# Patient Record
Sex: Female | Born: 1955 | Race: Black or African American | Hispanic: No | Smoking: Never smoker
Health system: Southern US, Community
[De-identification: ages and names within clinical notes are randomized; demographics above are authoritative.]

## PROBLEM LIST (undated history)

## (undated) DIAGNOSIS — M1990 Unspecified osteoarthritis, unspecified site: Secondary | ICD-10-CM

## (undated) DIAGNOSIS — I10 Essential (primary) hypertension: Secondary | ICD-10-CM

## (undated) DIAGNOSIS — E119 Type 2 diabetes mellitus without complications: Secondary | ICD-10-CM

## (undated) HISTORY — DX: Type 2 diabetes mellitus without complications: E11.9

## (undated) HISTORY — PX: TUBAL LIGATION: SHX77

---

## 2001-10-28 ENCOUNTER — Ambulatory Visit (HOSPITAL_COMMUNITY): Admission: RE | Admit: 2001-10-28 | Discharge: 2001-10-28 | Payer: Self-pay | Admitting: Family Medicine

## 2001-10-28 ENCOUNTER — Encounter: Payer: Self-pay | Admitting: Family Medicine

## 2010-12-24 ENCOUNTER — Emergency Department (HOSPITAL_COMMUNITY)
Admission: EM | Admit: 2010-12-24 | Discharge: 2010-12-24 | Disposition: A | Discharge status: Home / Self Care | Payer: Medicare Other | Attending: Emergency Medicine | Admitting: Emergency Medicine

## 2010-12-24 DIAGNOSIS — Z043 Encounter for examination and observation following other accident: Secondary | ICD-10-CM | POA: Insufficient documentation

## 2013-07-27 ENCOUNTER — Emergency Department (HOSPITAL_COMMUNITY): Payer: Medicare Other

## 2013-07-27 ENCOUNTER — Inpatient Hospital Stay (HOSPITAL_COMMUNITY)
Admission: EM | Admit: 2013-07-27 | Discharge: 2013-08-01 | DRG: 638 | Disposition: A | Discharge status: Home Health | Payer: Medicare Other | Attending: Pulmonary Disease | Admitting: Pulmonary Disease

## 2013-07-27 ENCOUNTER — Encounter (HOSPITAL_COMMUNITY): Payer: Self-pay | Admitting: Emergency Medicine

## 2013-07-27 DIAGNOSIS — A498 Other bacterial infections of unspecified site: Secondary | ICD-10-CM | POA: Diagnosis present

## 2013-07-27 DIAGNOSIS — E1110 Type 2 diabetes mellitus with ketoacidosis without coma: Secondary | ICD-10-CM | POA: Diagnosis present

## 2013-07-27 DIAGNOSIS — I129 Hypertensive chronic kidney disease with stage 1 through stage 4 chronic kidney disease, or unspecified chronic kidney disease: Secondary | ICD-10-CM | POA: Diagnosis present

## 2013-07-27 DIAGNOSIS — N390 Urinary tract infection, site not specified: Secondary | ICD-10-CM | POA: Diagnosis present

## 2013-07-27 DIAGNOSIS — Z833 Family history of diabetes mellitus: Secondary | ICD-10-CM

## 2013-07-27 DIAGNOSIS — I959 Hypotension, unspecified: Secondary | ICD-10-CM | POA: Diagnosis present

## 2013-07-27 DIAGNOSIS — Z6841 Body Mass Index (BMI) 40.0 and over, adult: Secondary | ICD-10-CM

## 2013-07-27 DIAGNOSIS — I10 Essential (primary) hypertension: Secondary | ICD-10-CM | POA: Diagnosis present

## 2013-07-27 DIAGNOSIS — N179 Acute kidney failure, unspecified: Secondary | ICD-10-CM | POA: Diagnosis present

## 2013-07-27 DIAGNOSIS — R531 Weakness: Secondary | ICD-10-CM

## 2013-07-27 DIAGNOSIS — N189 Chronic kidney disease, unspecified: Secondary | ICD-10-CM | POA: Diagnosis present

## 2013-07-27 DIAGNOSIS — E876 Hypokalemia: Secondary | ICD-10-CM | POA: Diagnosis present

## 2013-07-27 DIAGNOSIS — E1310 Other specified diabetes mellitus with ketoacidosis without coma: Principal | ICD-10-CM | POA: Diagnosis present

## 2013-07-27 DIAGNOSIS — M129 Arthropathy, unspecified: Secondary | ICD-10-CM | POA: Diagnosis present

## 2013-07-27 HISTORY — DX: Unspecified osteoarthritis, unspecified site: M19.90

## 2013-07-27 HISTORY — DX: Essential (primary) hypertension: I10

## 2013-07-27 LAB — BASIC METABOLIC PANEL
BUN: 48 mg/dL — ABNORMAL HIGH (ref 6–23)
BUN: 48 mg/dL — ABNORMAL HIGH (ref 6–23)
BUN: 47 mg/dL — AB (ref 6–23)
CALCIUM: 9.1 mg/dL (ref 8.4–10.5)
CO2: 17 mEq/L — ABNORMAL LOW (ref 19–32)
CO2: 18 mEq/L — ABNORMAL LOW (ref 19–32)
CO2: 21 mEq/L (ref 19–32)
CREATININE: 1.6 mg/dL — AB (ref 0.50–1.10)
Calcium: 9.3 mg/dL (ref 8.4–10.5)
Calcium: 9.3 mg/dL (ref 8.4–10.5)
Chloride: 85 mEq/L — ABNORMAL LOW (ref 96–112)
Chloride: 91 mEq/L — ABNORMAL LOW (ref 96–112)
Chloride: 97 mEq/L (ref 96–112)
Creatinine, Ser: 1.57 mg/dL — ABNORMAL HIGH (ref 0.50–1.10)
Creatinine, Ser: 1.53 mg/dL — ABNORMAL HIGH (ref 0.50–1.10)
GFR calc Af Amer: 41 mL/min — ABNORMAL LOW (ref >90)
GFR calc Af Amer: 40 mL/min — ABNORMAL LOW (ref >90)
GFR calc non Af Amer: 35 mL/min — ABNORMAL LOW (ref >90)
GFR calc non Af Amer: 36 mL/min — ABNORMAL LOW (ref >90)
GFR, EST AFRICAN AMERICAN: 42 mL/min — AB (ref >90)
GFR, EST NON AFRICAN AMERICAN: 34 mL/min — AB (ref >90)
Glucose, Bld: 911 mg/dL (ref 70–99)
Glucose, Bld: 690 mg/dL (ref 70–99)
Glucose, Bld: 386 mg/dL — ABNORMAL HIGH (ref 70–99)
POTASSIUM: 3.5 meq/L — AB (ref 3.7–5.3)
Potassium: 3.9 mEq/L (ref 3.7–5.3)
Potassium: 3.7 mEq/L (ref 3.7–5.3)
SODIUM: 137 meq/L (ref 137–147)
SODIUM: 141 meq/L (ref 137–147)
Sodium: 134 mEq/L — ABNORMAL LOW (ref 137–147)

## 2013-07-27 LAB — GLUCOSE, CAPILLARY
GLUCOSE-CAPILLARY: 254 mg/dL — AB (ref 70–99)
GLUCOSE-CAPILLARY: 566 mg/dL — AB (ref 70–99)
Glucose-Capillary: >600 mg/dL (ref 70–99)
Glucose-Capillary: >600 mg/dL (ref 70–99)
Glucose-Capillary: >600 mg/dL (ref 70–99)
Glucose-Capillary: >600 mg/dL (ref 70–99)
Glucose-Capillary: 424 mg/dL — ABNORMAL HIGH (ref 70–99)

## 2013-07-27 LAB — CBC WITH DIFFERENTIAL/PLATELET
Basophils Absolute: 0 10*3/uL (ref 0.0–0.1)
Basophils Relative: 0 % (ref 0–1)
Eosinophils Absolute: 0 10*3/uL (ref 0.0–0.7)
Eosinophils Relative: 0 % (ref 0–5)
HCT: 44.6 % (ref 36.0–46.0)
Hemoglobin: 14.5 g/dL (ref 12.0–15.0)
Lymphocytes Relative: 12 % (ref 12–46)
Lymphs Abs: 1 10*3/uL (ref 0.7–4.0)
MCH: 30.4 pg (ref 26.0–34.0)
MCHC: 32.5 g/dL (ref 30.0–36.0)
MCV: 93.5 fL (ref 78.0–100.0)
Monocytes Absolute: 0.5 10*3/uL (ref 0.1–1.0)
Monocytes Relative: 6 % (ref 3–12)
Neutro Abs: 7 10*3/uL (ref 1.7–7.7)
Neutrophils Relative %: 82 % — ABNORMAL HIGH (ref 43–77)
Platelets: 251 10*3/uL (ref 150–400)
RBC: 4.77 MIL/uL (ref 3.87–5.11)
RDW: 15.4 % (ref 11.5–15.5)
WBC: 8.5 10*3/uL (ref 4.0–10.5)

## 2013-07-27 LAB — PROTIME-INR
INR: 1.08 (ref 0.00–1.49)
Prothrombin Time: 13.8 seconds (ref 11.6–15.2)

## 2013-07-27 LAB — CBC
HEMATOCRIT: 40.9 % (ref 36.0–46.0)
Hemoglobin: 13.6 g/dL (ref 12.0–15.0)
MCH: 30 pg (ref 26.0–34.0)
MCHC: 33.3 g/dL (ref 30.0–36.0)
MCV: 90.1 fL (ref 78.0–100.0)
Platelets: 231 10*3/uL (ref 150–400)
RBC: 4.54 MIL/uL (ref 3.87–5.11)
RDW: 15.4 % (ref 11.5–15.5)
WBC: 8.2 10*3/uL (ref 4.0–10.5)

## 2013-07-27 LAB — COMPREHENSIVE METABOLIC PANEL
ALK PHOS: 153 U/L — AB (ref 39–117)
ALT: 21 U/L (ref 0–35)
AST: 13 U/L (ref 0–37)
Albumin: 3.6 g/dL (ref 3.5–5.2)
BUN: 47 mg/dL — ABNORMAL HIGH (ref 6–23)
CALCIUM: 9.7 mg/dL (ref 8.4–10.5)
CO2: 15 mEq/L — ABNORMAL LOW (ref 19–32)
Chloride: 81 mEq/L — ABNORMAL LOW (ref 96–112)
Creatinine, Ser: 1.6 mg/dL — ABNORMAL HIGH (ref 0.50–1.10)
GFR calc non Af Amer: 34 mL/min — ABNORMAL LOW (ref >90)
GFR, EST AFRICAN AMERICAN: 40 mL/min — AB (ref >90)
GLUCOSE: 1141 mg/dL — AB (ref 70–99)
Potassium: 4.8 mEq/L (ref 3.7–5.3)
Sodium: 133 mEq/L — ABNORMAL LOW (ref 137–147)
TOTAL PROTEIN: 7.8 g/dL (ref 6.0–8.3)
Total Bilirubin: 0.3 mg/dL (ref 0.3–1.2)

## 2013-07-27 LAB — MRSA PCR SCREENING: MRSA by PCR: NEGATIVE

## 2013-07-27 LAB — APTT: aPTT: 21 seconds — ABNORMAL LOW (ref 24–37)

## 2013-07-27 LAB — TROPONIN I

## 2013-07-27 MED ORDER — SODIUM CHLORIDE 0.9 % IV SOLN: Freq: Once | INTRAVENOUS | Status: AC

## 2013-07-27 MED ORDER — HEPARIN SODIUM (PORCINE) 5000 UNIT/ML IJ SOLN
5000.0000 [IU] | Freq: Three times a day (TID) | INTRAMUSCULAR | Status: DC
  Administered 2013-07-27 – 2013-08-01 (×14): 5000 [IU] via SUBCUTANEOUS
  Filled 2013-07-27 (×14): qty 1

## 2013-07-27 MED ORDER — LORAZEPAM 1 MG PO TABS
1.0000 mg | ORAL_TABLET | Freq: Once | ORAL | Status: AC
  Administered 2013-07-27: 1 mg via ORAL
  Filled 2013-07-27: qty 1

## 2013-07-27 MED ORDER — SODIUM CHLORIDE 0.9 % IV SOLN: INTRAVENOUS | Status: DC

## 2013-07-27 MED ORDER — POTASSIUM CHLORIDE 10 MEQ/100ML IV SOLN
INTRAVENOUS | Status: AC
  Filled 2013-07-27: qty 400

## 2013-07-27 MED ORDER — SODIUM CHLORIDE 0.9 % IV SOLN: INTRAVENOUS | Status: AC

## 2013-07-27 MED ORDER — DEXTROSE 50 % IV SOLN: 25.0000 mL | INTRAVENOUS | Status: DC | PRN

## 2013-07-27 MED ORDER — SODIUM CHLORIDE 0.9 % IV SOLN: 1000.0000 mL | INTRAVENOUS | Status: DC

## 2013-07-27 MED ORDER — DEXTROSE-NACL 5-0.45 % IV SOLN: INTRAVENOUS | Status: DC

## 2013-07-27 MED ORDER — SODIUM CHLORIDE 0.9 % IV SOLN
1000.0000 mL | Freq: Once | INTRAVENOUS | Status: AC
  Administered 2013-07-27: 1000 mL via INTRAVENOUS

## 2013-07-27 MED ORDER — SODIUM CHLORIDE 0.9 % IV BOLUS (SEPSIS): 1000.0000 mL | Freq: Once | INTRAVENOUS | Status: AC

## 2013-07-27 MED ORDER — SODIUM CHLORIDE 0.9 % IV SOLN
INTRAVENOUS | Status: DC
  Administered 2013-07-27: 18.2 [IU]/h via INTRAVENOUS
  Administered 2013-07-27: 10.8 [IU]/h via INTRAVENOUS
  Administered 2013-07-27: 5.4 [IU]/h via INTRAVENOUS
  Filled 2013-07-27: qty 1

## 2013-07-27 MED ORDER — POTASSIUM CHLORIDE 10 MEQ/100ML IV SOLN
10.0000 meq | INTRAVENOUS | Status: AC
  Administered 2013-07-27 (×4): 10 meq via INTRAVENOUS
  Filled 2013-07-27: qty 100

## 2013-07-27 MED ORDER — SODIUM CHLORIDE 0.9 % IV SOLN
INTRAVENOUS | Status: DC
  Administered 2013-07-28: 06:00:00 via INTRAVENOUS

## 2013-07-27 NOTE — ED Notes (Signed)
Pt's left arm started contracting again and pt's IV started coming out of her arm.  Removed IV, cath intact.  Placed 2 IVs in r arm.

## 2013-07-27 NOTE — ED Notes (Signed)
Upon assessment, pt's left hand is no longer contracted.  Pt says is starting to feel better.  Reports L sided weakness is also improving.

## 2013-07-27 NOTE — ED Notes (Signed)
CRITICAL VALUE ALERT  Critical value received:  Glucose 1141  Date of notification:  07/27/13  Time of notification:  1649  Critical value read back:yes  Nurse who received alert:  GM  MD notified (1st page):  1649  Time of first page:  1649  MD notified (2nd page):  Time of second page:  Responding MD:  Salley SlaughterIK  Time MD responded:  [phone]

## 2013-07-27 NOTE — ED Notes (Signed)
Pt refusing MRI.  Dr. Lynelle DoctorKnapp aware.  BP on monitor reads 78/51, manual pressure per Merilyn BabaG. [place] is 98/58.  HR 105.  Pt more relaxed.  Over 1/2 of first liter of NS infused.  Continuing to infuse NS and notified Dr. Lynelle DoctorKnapp.
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
## 2013-07-27 NOTE — ED Notes (Signed)
Weakness to left arm and numbness to left arm intermittent x 2-3 days.  Reports became constant around 1430 yesterday.  Weak grip strengths noted to left hand.  Pt also reports unsteady gait and slurred speech. Denies pain.

## 2013-07-27 NOTE — H&P (Signed)
Karen Rios MRN: [id number] DOB/AGE: 01/27/1956 58 y.o. Primary Care Physician:HAWKINS,EDWARD L, MD Admit date: 07/27/2013 Chief Complaint: Left arm weakness. Polydipsia, polyuria. HPI: This 58 year old lady, who has a history of hypertension and morbid obesity, presents with two-day history of left arm weakness associated with involuntary contraction of the left arm. She does not seem to describe a clonic/seizure type of activity but only contraction/cramping. She did not say that her left leg was weak. She did have slurred speech, which is now resolved There was also some associated confusion. When she was seen in emergency room, she was found to have newly diagnosed diabetes in mild DKA. She does admit to having polyuria and polydipsia for several weeks now associated with a 20 pound weight loss. She is now being admitted for mild DKA and hyperosmolar state.  Past Medical History  Diagnosis Date  . Hypertension   . Arthritis    History reviewed. No pertinent past surgical history.      No family history on file. No family history of diabetes. Social History:  reports that she has never smoked. She does not have any smokeless tobacco history on file. She reports that she does not drink alcohol or use illicit drugs. Her grandson lives with her.  Allergies: No Known Allergies  Medications Prior to Admission  Medication Sig Dispense Refill  . amLODipine (NORVASC) 5 MG tablet Take 5 mg by mouth every morning.      Marland Kitchen. amoxicillin (AMOXIL) 500 MG capsule Take 500 mg by mouth 3 (three) times daily. 10 day course starting on 07/22/2013      . benazepril (LOTENSIN) 20 MG tablet Take 20 mg by mouth 2 (two) times daily.      Marland Kitchen. doxycycline (VIBRAMYCIN) 100 MG capsule Take 100 mg by mouth 2 (two) times daily. *may take up to twice daily*      . furosemide (LASIX) 40 MG tablet Take 40 mg by mouth every morning.      . hydrochlorothiazide (MICROZIDE) 12.5 MG capsule Take 12.5 mg by mouth 2 (two)  times daily.      . Multiple Vitamins-Minerals (CENTRUM SILVER ULTRA WOMENS) TABS Take 1 tablet by mouth every morning.           WUJ:WJXBJROS:apart from the symptoms mentioned above,there are no other symptoms referable to all systems reviewed.  Physical Exam: Blood pressure 92/56, pulse 107, temperature 97.7 F (36.5 C), temperature source Oral, resp. rate 20, height 5\' 9"  (1.753 m), weight 95.255 kg (210 lb), SpO2 98.00%. She looks clinically dehydrated. She has a resting tachycardia. Her pulse is weak and thready. Heart sounds are present without murmur. Jugular venous pressure is not raised. Lung fields are clear. Abdomen soft and nontender. She is morbidly obese. She does appear to be alert and orientated without any focal neurological signs. She does have mild weakness in the left arm compared to the right. There is no facial asymmetry. There is no weakness in the legs.    Recent Labs  07/27/13 1547  WBC 8.5  NEUTROABS 7.0  HGB 14.5  HCT 44.6  MCV 93.5  PLT 251    Recent Labs  07/27/13 1548  NA 133*  K 4.8  CL 81*  CO2 15*  GLUCOSE 1141*  BUN 47*  CREATININE 1.60*  CALCIUM 9.7         Ct Head Wo Contrast  07/27/2013   CLINICAL DATA:  Left arm weakness and numbness ; slurred speech and gait disturbance  EXAM: CT HEAD  WITHOUT CONTRAST  TECHNIQUE: Contiguous axial images were obtained from the base of the skull through the vertex without intravenous contrast. Study was obtained within 24 hr of patient's arrival at the emergency department.  COMPARISON:  None.  FINDINGS: There is age related volume loss. There is no mass, hemorrhage, extra-axial fluid collection, or midline shift. The gray and white compartments appear normal. No acute infarct is appreciable. Bony calvarium appears intact. The mastoid air cells are clear.  IMPRESSION: Age related volume loss. No intracranial mass, hemorrhage, or acute infarct.   Electronically Signed   By: Bretta Bang M.D.   On:  07/27/2013 15:23   Impression: 1. Mild DKA. 2. Hyperosmolar diabetic state in a newly diagnosed diabetic patient. 3. Acute renal failure/dehydration secondary to #1 #2. 4. Hypertension, currently hypotensive. 5. Morbid obesity.     Plan: 1. Admit to intensive care unit. 2. Intravenous insulin. 3. Intravenous fluids. 4. Monitor electrolytes and glucose closely per protocol.  Further recommendations will depend on patient's hospital progress.      Whitfield Dulay C   07/27/2013, 6:37 PM
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
## 2013-07-28 LAB — CBC
HCT: 37.4 % (ref 36.0–46.0)
Hemoglobin: 13.1 g/dL (ref 12.0–15.0)
MCH: 29.8 pg (ref 26.0–34.0)
MCHC: 35 g/dL (ref 30.0–36.0)
MCV: 85 fL (ref 78.0–100.0)
PLATELETS: 221 10*3/uL (ref 150–400)
RBC: 4.4 MIL/uL (ref 3.87–5.11)
RDW: 14.8 % (ref 11.5–15.5)
WBC: 9.1 10*3/uL (ref 4.0–10.5)

## 2013-07-28 LAB — GLUCOSE, CAPILLARY
GLUCOSE-CAPILLARY: 214 mg/dL — AB (ref 70–99)
GLUCOSE-CAPILLARY: 129 mg/dL — AB (ref 70–99)
GLUCOSE-CAPILLARY: 208 mg/dL — AB (ref 70–99)
GLUCOSE-CAPILLARY: 349 mg/dL — AB (ref 70–99)
Glucose-Capillary: 151 mg/dL — ABNORMAL HIGH (ref 70–99)
Glucose-Capillary: 155 mg/dL — ABNORMAL HIGH (ref 70–99)
Glucose-Capillary: 123 mg/dL — ABNORMAL HIGH (ref 70–99)
Glucose-Capillary: 196 mg/dL — ABNORMAL HIGH (ref 70–99)
Glucose-Capillary: 185 mg/dL — ABNORMAL HIGH (ref 70–99)
Glucose-Capillary: 221 mg/dL — ABNORMAL HIGH (ref 70–99)

## 2013-07-28 LAB — GLUCOSE, RANDOM: GLUCOSE: 521 mg/dL — AB (ref 70–99)

## 2013-07-28 LAB — COMPREHENSIVE METABOLIC PANEL
ALK PHOS: 100 U/L (ref 39–117)
ALT: 18 U/L (ref 0–35)
AST: 14 U/L (ref 0–37)
Albumin: 3.2 g/dL — ABNORMAL LOW (ref 3.5–5.2)
BILIRUBIN TOTAL: 0.3 mg/dL (ref 0.3–1.2)
BUN: 46 mg/dL — ABNORMAL HIGH (ref 6–23)
CHLORIDE: 101 meq/L (ref 96–112)
CO2: 28 meq/L (ref 19–32)
Calcium: 9.1 mg/dL (ref 8.4–10.5)
Creatinine, Ser: 1.72 mg/dL — ABNORMAL HIGH (ref 0.50–1.10)
GFR, EST AFRICAN AMERICAN: 37 mL/min — AB (ref >90)
GFR, EST NON AFRICAN AMERICAN: 32 mL/min — AB (ref >90)
GLUCOSE: 120 mg/dL — AB (ref 70–99)
POTASSIUM: 3.5 meq/L — AB (ref 3.7–5.3)
SODIUM: 145 meq/L (ref 137–147)
Total Protein: 7.2 g/dL (ref 6.0–8.3)

## 2013-07-28 LAB — BASIC METABOLIC PANEL
BUN: 47 mg/dL — ABNORMAL HIGH (ref 6–23)
CHLORIDE: 100 meq/L (ref 96–112)
CO2: 24 mEq/L (ref 19–32)
CREATININE: 1.67 mg/dL — AB (ref 0.50–1.10)
Calcium: 9.3 mg/dL (ref 8.4–10.5)
GFR calc non Af Amer: 33 mL/min — ABNORMAL LOW (ref >90)
GFR, EST AFRICAN AMERICAN: 38 mL/min — AB (ref >90)
Glucose, Bld: 239 mg/dL — ABNORMAL HIGH (ref 70–99)
POTASSIUM: 3.7 meq/L (ref 3.7–5.3)
Sodium: 141 mEq/L (ref 137–147)

## 2013-07-28 LAB — HEMOGLOBIN A1C
Hgb A1c MFr Bld: 18.8 % — ABNORMAL HIGH (ref <5.7)
Mean Plasma Glucose: 493 mg/dL — ABNORMAL HIGH (ref <117)

## 2013-07-28 MED ORDER — NYSTATIN 100000 UNIT/GM EX POWD
Freq: Two times a day (BID) | CUTANEOUS | Status: DC
  Administered 2013-07-28 – 2013-07-30 (×5): via TOPICAL
  Administered 2013-07-31: 1 g via TOPICAL
  Administered 2013-07-31 – 2013-08-01 (×2): via TOPICAL
  Filled 2013-07-28: qty 15

## 2013-07-28 MED ORDER — INSULIN DETEMIR 100 UNIT/ML ~~LOC~~ SOLN
20.0000 [IU] | Freq: Every day | SUBCUTANEOUS | Status: DC
  Administered 2013-07-28: 20 [IU] via SUBCUTANEOUS
  Filled 2013-07-28: qty 0.2

## 2013-07-28 MED ORDER — ACETAMINOPHEN 325 MG PO TABS
650.0000 mg | ORAL_TABLET | ORAL | Status: DC | PRN
  Administered 2013-07-28 – 2013-07-31 (×12): 650 mg via ORAL
  Filled 2013-07-28 (×12): qty 2

## 2013-07-28 MED ORDER — INSULIN ASPART 100 UNIT/ML ~~LOC~~ SOLN
5.0000 [IU] | Freq: Three times a day (TID) | SUBCUTANEOUS | Status: DC
  Administered 2013-07-28 (×2): 5 [IU] via SUBCUTANEOUS

## 2013-07-28 MED ORDER — LIVING WELL WITH DIABETES BOOK
Freq: Once | Status: AC
  Administered 2013-07-28: 08:00:00
  Filled 2013-07-28: qty 1

## 2013-07-28 MED ORDER — SODIUM CHLORIDE 0.9 % IV SOLN
INTRAVENOUS | Status: DC
  Filled 2013-07-28: qty 1

## 2013-07-28 MED ORDER — INSULIN DETEMIR 100 UNIT/ML ~~LOC~~ SOLN: 10.0000 [IU] | Freq: Every day | SUBCUTANEOUS | Status: DC

## 2013-07-28 MED ORDER — SODIUM CHLORIDE 0.9 % IV SOLN: INTRAVENOUS | Status: DC

## 2013-07-28 MED ORDER — INSULIN ASPART 100 UNIT/ML ~~LOC~~ SOLN
0.0000 [IU] | SUBCUTANEOUS | Status: DC
  Administered 2013-07-28: 4 [IU] via SUBCUTANEOUS
  Administered 2013-07-28: 6 [IU] via SUBCUTANEOUS
  Administered 2013-07-28: 8 [IU] via SUBCUTANEOUS
  Administered 2013-07-29: 6 [IU] via SUBCUTANEOUS
  Administered 2013-07-29 (×3): 5 [IU] via SUBCUTANEOUS
  Administered 2013-07-29 (×2): 7 [IU] via SUBCUTANEOUS
  Administered 2013-07-30 (×3): 3 [IU] via SUBCUTANEOUS
  Administered 2013-07-30 (×2): 2 [IU] via SUBCUTANEOUS
  Administered 2013-07-30: 4 [IU] via SUBCUTANEOUS
  Administered 2013-07-31: 3 [IU] via SUBCUTANEOUS
  Administered 2013-07-31 (×2): 4 [IU] via SUBCUTANEOUS
  Administered 2013-07-31: 3 [IU] via SUBCUTANEOUS
  Administered 2013-07-31 (×2): 4 [IU] via SUBCUTANEOUS
  Administered 2013-08-01 (×2): 2 [IU] via SUBCUTANEOUS

## 2013-07-28 MED ORDER — INSULIN ASPART 100 UNIT/ML ~~LOC~~ SOLN: 0.0000 [IU] | Freq: Every day | SUBCUTANEOUS | Status: DC

## 2013-07-28 MED ORDER — DEXTROSE-NACL 5-0.45 % IV SOLN: INTRAVENOUS | Status: DC

## 2013-07-28 MED ORDER — INSULIN ASPART 100 UNIT/ML ~~LOC~~ SOLN
5.0000 [IU] | Freq: Once | SUBCUTANEOUS | Status: AC
  Administered 2013-07-28: 5 [IU] via SUBCUTANEOUS

## 2013-07-28 MED ORDER — DOCUSATE SODIUM 100 MG PO CAPS
100.0000 mg | ORAL_CAPSULE | Freq: Two times a day (BID) | ORAL | Status: DC
  Administered 2013-07-28 – 2013-08-01 (×9): 100 mg via ORAL
  Filled 2013-07-28 (×9): qty 1

## 2013-07-28 MED ORDER — INSULIN DETEMIR 100 UNIT/ML ~~LOC~~ SOLN
10.0000 [IU] | SUBCUTANEOUS | Status: AC
  Administered 2013-07-28: 10 [IU] via SUBCUTANEOUS
  Filled 2013-07-28: qty 0.1

## 2013-07-28 MED ORDER — DEXTROSE 50 % IV SOLN: 25.0000 mL | INTRAVENOUS | Status: DC | PRN

## 2013-07-28 MED ORDER — INSULIN REGULAR BOLUS VIA INFUSION
0.0000 [IU] | Freq: Three times a day (TID) | INTRAVENOUS | Status: DC
  Filled 2013-07-28: qty 10

## 2013-07-28 MED ORDER — INSULIN ASPART 100 UNIT/ML ~~LOC~~ SOLN: 0.0000 [IU] | Freq: Three times a day (TID) | SUBCUTANEOUS | Status: DC

## 2013-07-28 NOTE — Plan of Care (Signed)
Problem: Food- and Nutrition-Related Knowledge Deficit (NB-1.1) Goal: Nutrition education Formal process to instruct or train a patient/client in a skill or to impart knowledge to help patients/clients voluntarily manage or modify food choices and eating behavior to maintain or improve health. Outcome: Adequate for Discharge  RD consulted for nutrition education regarding diabetes.     No results found for this basename: HGBA1C    RD provided "Carbohydrate Counting for People with Diabetes" handout from the Academy of [employer]. Discussed different food groups and their effects on blood sugar, emphasizing carbohydrate-containing foods. Provided list of carbohydrates and recommended serving sizes of common foods.  Discussed importance of controlled and consistent carbohydrate intake throughout the day. Provided examples of ways to balance meals/snacks and encouraged intake of high-fiber, whole grain complex carbohydrates. Teach back method used.  Expect fair compliance.  Body mass index is 42.5 kg/(m^2). Pt meets criteria for extreme obesity, class III based on current BMI.  Current diet order is carb modified, patient is consuming approximately 100% of meals at this time. Labs and medications reviewed. No further nutrition interventions warranted at this time. RD contact information provided. If additional nutrition issues arise, please re-consult RD.  Also informed pt of free outpatient diabetes classes offered at [facility]. Provided flyer with class schedule and contact information to register. Encouraged attendance.   Jayce Kainz A. Mayford KnifeWilliams, RD, LDN Pager: [phone]

## 2013-07-28 NOTE — Consult Note (Signed)
Karen Rios MRN: [id number] DOB/AGE: Mar 18, 1956 58 y.o. Primary Care Physician:HAWKINS,EDWARD L, MD Admit date: 07/27/2013 Chief Complaint:  Consult for newly diagnosed type 2 DM , uncontrolled and complicated by DKA and AKI. HPI:  Mrs.  Hart Rios is a 58- yr- old female patient with medical history  as  Follows. Patient is being seen in consultation for newly diagnosed uncontrolled type 2 DM requested by Dr. Juanetta GoslingHawkins. She came to ER c/o left arm weakness which has subsequently resolved, but also has had polyuria, polydipsia x 2 days . She was found to have severely elevated BG above 1000 and impending DKA. Her renal function is abnormal with scr at 1.72. She required insulin drip before she was switched to basal insulin this morning. glycemic control was achieved quickly to target.  Patient's recent A1c is pending.  She never had any diabetes education. She denies history of CAD, CVA, CKD, Neuropathy, and Retinopathy. Pt gives a  family hx of type 2 DM in her aunt.  Patient is willing to engage in intensive monitoring and therapy along with change in life style.  Past Medical History  Diagnosis Date  . Hypertension   . Arthritis     No family history on file. Diabetes in her aunt.  Social History:  reports that she has never smoked. She does not have any smokeless tobacco history on file. She reports that she does not drink alcohol or use illicit drugs.   Allergies: No Known Allergies  Medications Prior to Admission  Medication Sig Dispense Refill  . amLODipine (NORVASC) 5 MG tablet Take 5 mg by mouth every morning.      Marland Kitchen. amoxicillin (AMOXIL) 500 MG capsule Take 500 mg by mouth 3 (three) times daily. 10 day course starting on 07/22/2013      . benazepril (LOTENSIN) 20 MG tablet Take 20 mg by mouth 2 (two) times daily.      Marland Kitchen. doxycycline (VIBRAMYCIN) 100 MG capsule Take 100 mg by mouth 2 (two) times daily. *may take up to twice daily*      . furosemide (LASIX) 40 MG tablet Take 40 mg  by mouth every morning.      . hydrochlorothiazide (MICROZIDE) 12.5 MG capsule Take 12.5 mg by mouth 2 (two) times daily.      . Multiple Vitamins-Minerals (CENTRUM SILVER ULTRA WOMENS) TABS Take 1 tablet by mouth every morning.           WUJ:WJXBJROS:apart from the symptoms mentioned above,there are no other symptoms referable to all systems reviewed.  Physical Exam: Blood pressure 118/76, pulse 80, temperature 97.5 F (36.4 C), temperature source Oral, resp. rate 17, height 5\' 9"  (1.753 m), weight 130.6 kg (287 lb 14.7 oz), SpO2 100.00%.  Gen: sick looking , but not in acute distress.  HEENT: wear a CPAP machine.  Neck: thyroid palpable , but not significantly enlarged.  Chest : poor air entry, scattered wheezes On bilateral lung fileds.  CVS: Distant heart sounds.  Abd; soft , non tender.  Ext: no edema.  CNS: non focal sensory and motor exam  Skin: no rash, nor hyperemia.   Recent Labs  07/27/13 1547 07/27/13 1931 07/28/13 0409  WBC 8.5 8.2 9.1  NEUTROABS 7.0  --   --   HGB 14.5 13.6 13.1  HCT 44.6 40.9 37.4  MCV 93.5 90.1 85.0  PLT 251 231 221    Recent Labs  07/28/13 0022 07/28/13 0409  NA 141 145  K 3.7 3.5*  CL 100 101  CO2 24 28  GLUCOSE 239* 120*  BUN 47* 46*  CREATININE 1.67* 1.72*  CALCIUM 9.3 9.1    Recent Labs  07/27/13 1548 07/28/13 0409  AST 13 14  ALT 21 18  ALKPHOS 153* 100  BILITOT 0.3 0.3  PROT 7.8 7.2  ALBUMIN 3.6 3.2*   Recent Results (from the past 240 hour(s))  MRSA PCR SCREENING     Status: None   Collection Time    07/27/13  6:36 PM      Result Value Range Status   MRSA by PCR NEGATIVE  NEGATIVE Final   Comment:            The GeneXpert MRSA Assay (FDA     approved for NASAL specimens     only), is one component of a     comprehensive MRSA colonization     surveillance program. It is not     intended to diagnose MRSA     infection nor to guide or     monitor treatment for     MRSA infections.    Ct Head Wo  Contrast  07/27/2013   CLINICAL DATA:  Left arm weakness and numbness ; slurred speech and gait disturbance  EXAM: CT HEAD WITHOUT CONTRAST  TECHNIQUE: Contiguous axial images were obtained from the base of the skull through the vertex without intravenous contrast. Study was obtained within 24 hr of patient's arrival at the emergency department.  COMPARISON:  None.  FINDINGS: There is age related volume loss. There is no mass, hemorrhage, extra-axial fluid collection, or midline shift. The gray and white compartments appear normal. No acute infarct is appreciable. Bony calvarium appears intact. The mastoid air cells are clear.  IMPRESSION: Age related volume loss. No intracranial mass, hemorrhage, or acute infarct.   Electronically Signed   By: Bretta Bang M.D.   On: 07/27/2013 15:23   Impression:  Active Problems:   DKA (diabetic ketoacidoses)   HTN (hypertension)   Obesity  Plan:  [name] is being diagnosed with type 2 DM which is currently uncontrolled and presented with impending DKA. Her a1c is pending. Acutely, she will need to stay on intensive basal/bolus insulin. I will increase her Levemir to 20 units qhs, initiate Novolog 5 units TIDAC plus SSI, associated with monitoring of BG ac and HS. She will need bedside diabetes education in anticipation of discharge on insulin.  She has abnormal renal function , not clear if it is chronic. We can not initiate safe oral medications now. In the long run, patient remains high risk for  CAD, CVA,retinopathy, and neuropathy. These are all discussed with the patient.  I have counseled the patient on diet management and weight loss, by adopting a carbohydrate restricted diet.  More education to be done as out patient in 1 week.   Thank you for involving me in the care of this patient, and I will continue to follow her up  with you to adjust her insulin in house and as out patient.   Araseli Sherry 07/28/2013, 10:15 AM

## 2013-07-28 NOTE — Progress Notes (Signed)
Pt came up to floor from ICU. She is complaining of moderate pain to her left arm but otherwise is in NAD. Will continue to monitor.

## 2013-07-28 NOTE — Progress Notes (Signed)
Patient transferred to room 315. Report given to Gafferarah RN. Vital signs stable at transfer.

## 2013-07-28 NOTE — Progress Notes (Signed)
[facility] chart review completed.  

## 2013-07-28 NOTE — Progress Notes (Signed)
Subjective: She was admitted last night with new onset diabetes and mild DKA. She had not had evidence of diabetes previously. She's also had weakness in her left arm.  Objective: Vital signs in last 24 hours: Temp:  [97.3 F (36.3 C)-97.7 F (36.5 C)] 97.3 F (36.3 C) (01/14 0400) Pulse Rate:  [80-123] 80 (01/14 0300) Resp:  [15-38] 15 (01/14 0700) BP: (58-117)/(35-63) 98/63 mmHg (01/14 0700) SpO2:  [95 %-100 %] 100 % (01/14 0600) Weight:  [95.255 kg (210 lb)-130.6 kg (287 lb 14.7 oz)] 130.6 kg (287 lb 14.7 oz) (01/14 0500) Weight change:  Last BM Date: 07/27/13  Intake/Output from previous day: 01/13 0701 - 01/14 0700 In: 187.5 [I.V.:187.5] Out: 2 [Urine:1; Stool:1]  PHYSICAL EXAM General appearance: alert, cooperative, no distress and morbidly obese Resp: clear to auscultation bilaterally Cardio: regular rate and rhythm, S1, S2 normal, no murmur, click, rub or gallop GI: soft, non-tender; bowel sounds normal; no masses,  no organomegaly Extremities: extremities normal, atraumatic, no cyanosis or edema  Lab Results:    Basic Metabolic Panel:  Recent Labs  16/10/96 0022 07/28/13 0409  NA 141 145  K 3.7 3.5*  CL 100 101  CO2 24 28  GLUCOSE 239* 120*  BUN 47* 46*  CREATININE 1.67* 1.72*  CALCIUM 9.3 9.1   Liver Function Tests:  Recent Labs  07/27/13 1548 07/28/13 0409  AST 13 14  ALT 21 18  ALKPHOS 153* 100  BILITOT 0.3 0.3  PROT 7.8 7.2  ALBUMIN 3.6 3.2*   No results found for this basename: LIPASE, AMYLASE,  in the last 72 hours No results found for this basename: AMMONIA,  in the last 72 hours CBC:  Recent Labs  07/27/13 1547 07/27/13 1931 07/28/13 0409  WBC 8.5 8.2 9.1  NEUTROABS 7.0  --   --   HGB 14.5 13.6 13.1  HCT 44.6 40.9 37.4  MCV 93.5 90.1 85.0  PLT 251 231 221   Cardiac Enzymes:  Recent Labs  07/27/13 1548  TROPONINI <0.30   BNP: No results found for this basename: PROBNP,  in the last 72 hours D-Dimer: No results  found for this basename: DDIMER,  in the last 72 hours CBG:  Recent Labs  07/28/13 0101 07/28/13 0159 07/28/13 0310 07/28/13 0409 07/28/13 0457 07/28/13 0734  GLUCAP 214* 151* 155* 129* 123* 196*   Hemoglobin A1C: No results found for this basename: HGBA1C,  in the last 72 hours Fasting Lipid Panel: No results found for this basename: CHOL, HDL, LDLCALC, TRIG, CHOLHDL, LDLDIRECT,  in the last 72 hours Thyroid Function Tests: No results found for this basename: TSH, T4TOTAL, FREET4, T3FREE, THYROIDAB,  in the last 72 hours Anemia Panel: No results found for this basename: VITAMINB12, FOLATE, FERRITIN, TIBC, IRON, RETICCTPCT,  in the last 72 hours Coagulation:  Recent Labs  07/27/13 1548  LABPROT 13.8  INR 1.08   Urine Drug Screen: Drugs of Abuse  No results found for this basename: labopia, cocainscrnur, labbenz, amphetmu, thcu, labbarb    Alcohol Level: No results found for this basename: ETH,  in the last 72 hours Urinalysis: No results found for this basename: COLORURINE, APPERANCEUR, LABSPEC, PHURINE, GLUCOSEU, HGBUR, BILIRUBINUR, KETONESUR, PROTEINUR, UROBILINOGEN, NITRITE, LEUKOCYTESUR,  in the last 72 hours Misc. Labs:  ABGS No results found for this basename: PHART, PCO2, PO2ART, TCO2, HCO3,  in the last 72 hours CULTURES Recent Results (from the past 240 hour(s))  MRSA PCR SCREENING     Status: None   Collection Time  07/27/13  6:36 PM      Result Value Range Status   MRSA by PCR NEGATIVE  NEGATIVE Final   Comment:            The GeneXpert MRSA Assay (FDA     approved for NASAL specimens     only), is one component of a     comprehensive MRSA colonization     surveillance program. It is not     intended to diagnose MRSA     infection nor to guide or     monitor treatment for     MRSA infections.   Studies/Results: Ct Head Wo Contrast  07/27/2013   CLINICAL DATA:  Left arm weakness and numbness ; slurred speech and gait disturbance  EXAM: CT HEAD  WITHOUT CONTRAST  TECHNIQUE: Contiguous axial images were obtained from the base of the skull through the vertex without intravenous contrast. Study was obtained within 24 hr of patient's arrival at the emergency department.  COMPARISON:  None.  FINDINGS: There is age related volume loss. There is no mass, hemorrhage, extra-axial fluid collection, or midline shift. The gray and white compartments appear normal. No acute infarct is appreciable. Bony calvarium appears intact. The mastoid air cells are clear.  IMPRESSION: Age related volume loss. No intracranial mass, hemorrhage, or acute infarct.   Electronically Signed   By: Bretta Bang M.D.   On: 07/27/2013 15:23    Medications:  Prior to Admission:  Prescriptions prior to admission  Medication Sig Dispense Refill  . amLODipine (NORVASC) 5 MG tablet Take 5 mg by mouth every morning.      Marland Kitchen amoxicillin (AMOXIL) 500 MG capsule Take 500 mg by mouth 3 (three) times daily. 10 day course starting on 07/22/2013      . benazepril (LOTENSIN) 20 MG tablet Take 20 mg by mouth 2 (two) times daily.      Marland Kitchen doxycycline (VIBRAMYCIN) 100 MG capsule Take 100 mg by mouth 2 (two) times daily. *may take up to twice daily*      . furosemide (LASIX) 40 MG tablet Take 40 mg by mouth every morning.      . hydrochlorothiazide (MICROZIDE) 12.5 MG capsule Take 12.5 mg by mouth 2 (two) times daily.      . Multiple Vitamins-Minerals (CENTRUM SILVER ULTRA WOMENS) TABS Take 1 tablet by mouth every morning.       Scheduled: . sodium chloride   Intravenous Once  . heparin  5,000 Units Subcutaneous Q8H  . insulin aspart  0-20 Units Subcutaneous TID WC  . insulin aspart  0-5 Units Subcutaneous QHS  . insulin detemir  10 Units Subcutaneous QHS  . insulin regular  0-10 Units Intravenous TID WC  . sodium chloride  1,000 mL Intravenous Once   Continuous: . sodium chloride 150 mL/hr at 07/28/13 0700  . sodium chloride    . dextrose 5 % and 0.45% NaCl    . dextrose 5 % and  0.45% NaCl    . insulin (NOVOLIN-R) infusion 18.2 Units/hr (07/27/13 2315)  . insulin (NOVOLIN-R) infusion     ONG:EXBMWUXL, dextrose  Assesment: She has diabetic ketoacidosis but her last for blood sugars are within range. She has hypertension which is doing okay. She has morbid obesity. She had problems with left arm weakness and slurred speech and I wonder if she had TIA. Active Problems:   DKA (diabetic ketoacidoses)   HTN (hypertension)   Obesity    Plan: She will be transitioned to sliding scale  insulin and come off the insulin continuous infusion. I will request endocrinology and neurology consultation    LOS: 1 day   Ilaisaane Marts L 07/28/2013, 7:57 AM

## 2013-07-29 ENCOUNTER — Inpatient Hospital Stay (HOSPITAL_COMMUNITY): Payer: Medicare Other

## 2013-07-29 ENCOUNTER — Inpatient Hospital Stay (HOSPITAL_COMMUNITY)
Admit: 2013-07-29 | Discharge: 2013-07-29 | Disposition: A | Discharge status: Home / Self Care | Payer: Medicare Other | Attending: Neurology | Admitting: Neurology

## 2013-07-29 LAB — URINALYSIS, ROUTINE W REFLEX MICROSCOPIC
BILIRUBIN URINE: NEGATIVE
Glucose, UA: >1000 mg/dL — AB
Ketones, ur: NEGATIVE mg/dL
Nitrite: NEGATIVE
PROTEIN: NEGATIVE mg/dL
Specific Gravity, Urine: 1.015 (ref 1.005–1.030)
UROBILINOGEN UA: 0.2 mg/dL (ref 0.0–1.0)
pH: 5.5 (ref 5.0–8.0)

## 2013-07-29 LAB — GLUCOSE, CAPILLARY
Glucose-Capillary: 399 mg/dL — ABNORMAL HIGH (ref 70–99)
Glucose-Capillary: 422 mg/dL — ABNORMAL HIGH (ref 70–99)
Glucose-Capillary: 460 mg/dL — ABNORMAL HIGH (ref 70–99)
Glucose-Capillary: 280 mg/dL — ABNORMAL HIGH (ref 70–99)
Glucose-Capillary: 297 mg/dL — ABNORMAL HIGH (ref 70–99)

## 2013-07-29 LAB — URINE MICROSCOPIC-ADD ON

## 2013-07-29 MED ORDER — INSULIN ASPART 100 UNIT/ML ~~LOC~~ SOLN
15.0000 [IU] | Freq: Three times a day (TID) | SUBCUTANEOUS | Status: DC
  Administered 2013-07-29 – 2013-07-30 (×6): 15 [IU] via SUBCUTANEOUS

## 2013-07-29 MED ORDER — INSULIN DETEMIR 100 UNIT/ML ~~LOC~~ SOLN
60.0000 [IU] | Freq: Every day | SUBCUTANEOUS | Status: DC
  Administered 2013-07-29 – 2013-07-31 (×3): 60 [IU] via SUBCUTANEOUS
  Filled 2013-07-29 (×4): qty 0.6

## 2013-07-29 MED ORDER — INSULIN PEN STARTER KIT
1.0000 | Freq: Once | Status: AC
  Administered 2013-07-29: 1
  Filled 2013-07-29: qty 1

## 2013-07-29 NOTE — Progress Notes (Signed)
Spoke with patient about new diabetes diagnosis. Discussed A1C results (18.8% on 07/28/13) and explained what an A1C is, basic pathophysiology of DM Type 2, basic home care, importance of checking CBGs and maintaining good CBG control to prevent long-term and short-term complications. Reviewed signs and symptoms of hyperglycemia and hypoglycemia along with treatment for both. Discussed impact of nutrition, exercise, stress, and sickness on diabetes control.  Discussed basics of carbohydrate counting and provided handout materials for patient to review.  Patient also states that the dietician also talked with her about nutrition.  Reviewed Living Well with Diabetes book in detail with the patient.  Concerned about understanding that patient is retaining because she was not able to verbalize answers to several questions asked about diabetes after teaching occurred.  Highly recommend patient receive outpatient diabetes education to reiterate information taught about diabetes.  During discussion with the patient, bedside RN came in with insulin injection and asked that the patient administer the injection her self.  Patient stated she "could not do it" due to her left arm cramping up and her right arm being sore due to IVs.  Explained to the patient the importance of her giving herself the insulin injections so we are sure she will be able to care for herself after discharge.  Patient stated that she understood but she wanted the bedside RN to administer the injection this time.  RNs to provide ongoing basic DM education at bedside with this patient and engage patient to actively check blood glucose and administer insulin injections. Have ordered educational booklet, insulin starter kit, and DM videos.  Educated patient on insulin pen use at home. Reviewed all steps if insulin pen including attachment of needle, 2-unit air shot, dialing up dose, giving injection, removing needle, disposal of sharps, storage of unused  insulin, disposal of insulin etc. Patient reports that she has arthritis in her hands and she was having a hard time pushing down the button to administer the insulin with the test insulin pen.  Patient was not able to provide successful return demonstration or verbalize steps of using an insulin pen without prompting.  Placed test insulin pen at nursing station and asked that bedside RN demonstrate insulin pen use and technique with patient several times prior to discharge to ensure she can competently use an insulin pen prior to discharge.   Recommend ordering Levemir FlexPen and Novolog FlexPen at discharge.    MD to give patient Rxs for glucometer, Levemir FlexPen, Novolog FlexPen, and insulin pen needles at discharge.    Thanks, Barnie Alderman, RN, MSN, CCRN Diabetes Coordinator Inpatient Diabetes Program [phone] (Team Pager) [phone] ([facility] office) [phone] [facility] office)

## 2013-07-29 NOTE — Progress Notes (Signed)
EEG Completed; Results Pending  

## 2013-07-29 NOTE — Care Management Note (Signed)
    Page 1 of 1   07/29/2013     2:20:55 PM   CARE MANAGEMENT NOTE 07/29/2013  Patient:  Georgina QuintLAWSON,Zayda J   Account Number:  [id number]  Date Initiated:  07/29/2013  Documentation initiated by:  Sharrie RothmanBLACKWELL,Pennye Beeghly C  Subjective/Objective Assessment:   Pt admitted from home with DKA. Pt lives with her grandson and will return home at discharge. Pt is independent with ADL's.     Action/Plan:   Pt going home on insulin which is very new and unpsetting to pt. Pt will benfit [facility] and has chosen [facility]. Alroy [facility]. Pts insurance will cover insulin flexipen and pt is aware.   Anticipated DC Date:  07/30/2013   Anticipated DC Plan:  HOME W HOME HEALTH SERVICES      DC Planning Services  CM consult      Choice offered to / List presented to:             Status of service:  Completed, signed off Medicare Important Message given?   (If response is "NO", the following Medicare IM given date fields will be blank) Date Medicare IM given:   Date Additional Medicare IM given:    Discharge Disposition:  HOME W HOME HEALTH SERVICES  Per UR Regulation:    If discussed at Long Length of Stay Meetings, dates discussed:    Comments:  07/29/13 1420 Arlyss Queenammy Larae Caison, RN BSN CM

## 2013-07-29 NOTE — Consult Note (Signed)
[place] A. Merlene Laughter, MD     [email or web address]          Karen Rios is an 58 y.o. female.   ASSESSMENT/PLAN:  1. Episodic flexor spasms of the left upper extremity likely due to severe hyperglycemia. An EEG will be obtained however. A carotid duplex Doppler will also be obtained.  The patient is a 58 year old black female who presents with new onset deep DKA and new onset diabetes. Along with the patient sentence symptoms, the patient presented with shaking of the left upper extremity. The shaking lasts for a few seconds but recurred repeatedly throughout the day on yesterday. She apparently has a couple spells this morning. There are no loss of consciousness, chest pain, shows of breath, headaches or numbness. Review of systems all with negative. The patient did have a head CT scan which was unremarkable. Brain MRI was attempted but the patient appears quite claustrophobic.  GENERAL: This is a very pleasant obese female in no acute distress.  HEENT: Unremarkable although she has a large neck. Neck is supple and head is normocephalic/atraumatic.  ABDOMEN: soft  EXTREMITIES: No edema   BACK: Unremarkable.  SKIN: Normal by inspection.    MENTAL STATUS: Alert and oriented. Speech, language and cognition are generally intact. Judgment and insight normal.   CRANIAL NERVES: Pupils are equal, round and reactive to light and accommodation; extra ocular movements are full, there is no significant nystagmus; visual fields are full; upper and lower facial muscles are normal in strength and symmetric, there is no flattening of the nasolabial folds; tongue is midline; uvula is midline; shoulder elevation is normal.  MOTOR: There is a mild pronator drift of the left upper extremity. On testing of the strength of the left hand she developed a clear episode of flexor spasms of the left hand and forearm last for several seconds. Hand grip strength was therefore week 4+/5.  Deltoid 5. The other extremities shows normal tone, bulk and strength.  COORDINATION: Left finger to nose is normal, right finger to nose is normal, No rest tremor; no intention tremor; no postural tremor; no bradykinesia.  REFLEXES: Deep tendon reflexes are symmetrical and normal. Plantar responses are flexor bilaterally.   SENSATION: Normal to light touch and temperature.    Past Medical History  Diagnosis Date  . Hypertension   . Arthritis     History reviewed. No pertinent past surgical history.  No family history on file.  Social History:  reports that she has never smoked. She does not have any smokeless tobacco history on file. She reports that she does not drink alcohol or use illicit drugs.  Allergies: No Known Allergies  Medications: Prior to Admission medications   Medication Sig Start Date End Date Taking? Authorizing Provider  amLODipine (NORVASC) 5 MG tablet Take 5 mg by mouth every morning.   Yes Historical Provider, MD  amoxicillin (AMOXIL) 500 MG capsule Take 500 mg by mouth 3 (three) times daily. 10 day course starting on 07/22/2013   Yes Historical Provider, MD  benazepril (LOTENSIN) 20 MG tablet Take 20 mg by mouth 2 (two) times daily.   Yes Historical Provider, MD  doxycycline (VIBRAMYCIN) 100 MG capsule Take 100 mg by mouth 2 (two) times daily. *may take up to twice daily*   Yes Historical Provider, MD  furosemide (LASIX) 40 MG tablet Take 40 mg by mouth every morning.   Yes Historical Provider, MD  hydrochlorothiazide (MICROZIDE) 12.5 MG capsule Take 12.5 mg by mouth 2 (two)  times daily.   Yes Historical Provider, MD  Multiple Vitamins-Minerals (CENTRUM SILVER ULTRA WOMENS) TABS Take 1 tablet by mouth every morning.   Yes Historical Provider, MD     Scheduled Meds: . sodium chloride   Intravenous Once  . docusate sodium  100 mg Oral BID  . heparin  5,000 Units Subcutaneous Q8H  . insulin aspart  0-24 Units Subcutaneous Q4H  . insulin aspart  15 Units  Subcutaneous TID WC  . insulin detemir  60 Units Subcutaneous QHS  . nystatin   Topical BID  . sodium chloride  1,000 mL Intravenous Once   Continuous Infusions: . sodium chloride 150 mL/hr at 07/28/13 0800  . dextrose 5 % and 0.45% NaCl     PRN Meds:.acetaminophen, dextrose   Blood pressure 107/69, pulse 84, temperature 97.5 F (36.4 C), temperature source Oral, resp. rate 18, height '5\' 9"'  (1.753 m), weight 130.6 kg (287 lb 14.7 oz), SpO2 100.00%.   Results for orders placed during the hospital encounter of 07/27/13 (from the past 48 hour(s))  CBC WITH DIFFERENTIAL     Status: Abnormal   Collection Time    07/27/13  3:47 PM      Result Value Range   WBC 8.5  4.0 - 10.5 K/uL   RBC 4.77  3.87 - 5.11 MIL/uL   Hemoglobin 14.5  12.0 - 15.0 g/dL   HCT 44.6  36.0 - 46.0 %   MCV 93.5  78.0 - 100.0 fL   MCH 30.4  26.0 - 34.0 pg   MCHC 32.5  30.0 - 36.0 g/dL   RDW 15.4  11.5 - 15.5 %   Platelets 251  150 - 400 K/uL   Neutrophils Relative % 82 (*) 43 - 77 %   Neutro Abs 7.0  1.7 - 7.7 K/uL   Lymphocytes Relative 12  12 - 46 %   Lymphs Abs 1.0  0.7 - 4.0 K/uL   Monocytes Relative 6  3 - 12 %   Monocytes Absolute 0.5  0.1 - 1.0 K/uL   Eosinophils Relative 0  0 - 5 %   Eosinophils Absolute 0.0  0.0 - 0.7 K/uL   Basophils Relative 0  0 - 1 %   Basophils Absolute 0.0  0.0 - 0.1 K/uL  COMPREHENSIVE METABOLIC PANEL     Status: Abnormal   Collection Time    07/27/13  3:48 PM      Result Value Range   Sodium 133 (*) 137 - 147 mEq/L   Potassium 4.8  3.7 - 5.3 mEq/L   Chloride 81 (*) 96 - 112 mEq/L   CO2 15 (*) 19 - 32 mEq/L   Glucose, Bld 1141 (*) 70 - 99 mg/dL   Comment: CRITICAL RESULT CALLED TO, READ BACK BY AND VERIFIED WITH:     MEADOWS,G ON 07/27/13 AT 1645 BY LOY,C   BUN 47 (*) 6 - 23 mg/dL   Creatinine, Ser 1.60 (*) 0.50 - 1.10 mg/dL   Calcium 9.7  8.4 - 10.5 mg/dL   Total Protein 7.8  6.0 - 8.3 g/dL   Albumin 3.6  3.5 - 5.2 g/dL   AST 13  0 - 37 U/L   ALT 21  0 - 35 U/L     Alkaline Phosphatase 153 (*) 39 - 117 U/L   Total Bilirubin 0.3  0.3 - 1.2 mg/dL   GFR calc non Af Amer 34 (*) >90 mL/min   GFR calc Af Amer 40 (*) >90 mL/min  Comment: (NOTE)     The eGFR has been calculated using the CKD EPI equation.     This calculation has not been validated in all clinical situations.     eGFR's persistently <90 mL/min signify possible Chronic Kidney     Disease.  APTT     Status: Abnormal   Collection Time    07/27/13  3:48 PM      Result Value Range   aPTT 21 (*) 24 - 37 seconds  PROTIME-INR     Status: None   Collection Time    07/27/13  3:48 PM      Result Value Range   Prothrombin Time 13.8  11.6 - 15.2 seconds   INR 1.08  0.00 - 1.49  TROPONIN I     Status: None   Collection Time    07/27/13  3:48 PM      Result Value Range   Troponin I <0.30  <0.30 ng/mL   Comment:            Due to the release kinetics of cTnI,     a negative result within the first hours     of the onset of symptoms does not rule out     myocardial infarction with certainty.     If myocardial infarction is still suspected,     repeat the test at appropriate intervals.  GLUCOSE, CAPILLARY     Status: Abnormal   Collection Time    07/27/13  5:22 PM      Result Value Range   Glucose-Capillary >600 (*) 70 - 99 mg/dL  GLUCOSE, CAPILLARY     Status: Abnormal   Collection Time    07/27/13  6:30 PM      Result Value Range   Glucose-Capillary >600 (*) 70 - 99 mg/dL  MRSA PCR SCREENING     Status: None   Collection Time    07/27/13  6:36 PM      Result Value Range   MRSA by PCR NEGATIVE  NEGATIVE   Comment:            The GeneXpert MRSA Assay (FDA     approved for NASAL specimens     only), is one component of a     comprehensive MRSA colonization     surveillance program. It is not     intended to diagnose MRSA     infection nor to guide or     monitor treatment for     MRSA infections.  GLUCOSE, CAPILLARY     Status: Abnormal   Collection Time    07/27/13  7:28 PM       Result Value Range   Glucose-Capillary >600 (*) 70 - 99 mg/dL  BASIC METABOLIC PANEL     Status: Abnormal   Collection Time    07/27/13  7:31 PM      Result Value Range   Sodium 134 (*) 137 - 147 mEq/L   Potassium 3.9  3.7 - 5.3 mEq/L   Comment: DELTA CHECK NOTED   Chloride 85 (*) 96 - 112 mEq/L   CO2 17 (*) 19 - 32 mEq/L   Glucose, Bld 911 (*) 70 - 99 mg/dL   Comment: CRITICAL RESULT CALLED TO, READ BACK BY AND VERIFIED WITH:     CUMMINGS,R ON 07/27/13 AT 2050 BY LOY,C   BUN 48 (*) 6 - 23 mg/dL   Creatinine, Ser 1.57 (*) 0.50 - 1.10 mg/dL   Calcium 9.1  8.4 -  10.5 mg/dL   GFR calc non Af Amer 35 (*) >90 mL/min   GFR calc Af Amer 41 (*) >90 mL/min   Comment: (NOTE)     The eGFR has been calculated using the CKD EPI equation.     This calculation has not been validated in all clinical situations.     eGFR's persistently <90 mL/min signify possible Chronic Kidney     Disease.  CBC     Status: None   Collection Time    07/27/13  7:31 PM      Result Value Range   WBC 8.2  4.0 - 10.5 K/uL   RBC 4.54  3.87 - 5.11 MIL/uL   Hemoglobin 13.6  12.0 - 15.0 g/dL   HCT 40.9  36.0 - 46.0 %   MCV 90.1  78.0 - 100.0 fL   MCH 30.0  26.0 - 34.0 pg   MCHC 33.3  30.0 - 36.0 g/dL   RDW 15.4  11.5 - 15.5 %   Platelets 231  150 - 400 K/uL  GLUCOSE, CAPILLARY     Status: Abnormal   Collection Time    07/27/13  8:34 PM      Result Value Range   Glucose-Capillary >600 (*) 70 - 99 mg/dL  BASIC METABOLIC PANEL     Status: Abnormal   Collection Time    07/27/13  8:57 PM      Result Value Range   Sodium 137  137 - 147 mEq/L   Potassium 3.7  3.7 - 5.3 mEq/L   Chloride 91 (*) 96 - 112 mEq/L   CO2 18 (*) 19 - 32 mEq/L   Glucose, Bld 690 (*) 70 - 99 mg/dL   Comment: CRITICAL RESULT CALLED TO, READ BACK BY AND VERIFIED WITH:     WAGONER,R ON 07/27/13 AT 2140 BY LOY,C   BUN 48 (*) 6 - 23 mg/dL   Creatinine, Ser 1.60 (*) 0.50 - 1.10 mg/dL   Calcium 9.3  8.4 - 10.5 mg/dL   GFR calc non Af Amer  34 (*) >90 mL/min   GFR calc Af Amer 40 (*) >90 mL/min   Comment: (NOTE)     The eGFR has been calculated using the CKD EPI equation.     This calculation has not been validated in all clinical situations.     eGFR's persistently <90 mL/min signify possible Chronic Kidney     Disease.  GLUCOSE, CAPILLARY     Status: Abnormal   Collection Time    07/27/13  9:28 PM      Result Value Range   Glucose-Capillary 566 (*) 70 - 99 mg/dL   Comment 1 Notify RN    GLUCOSE, CAPILLARY     Status: Abnormal   Collection Time    07/27/13 10:36 PM      Result Value Range   Glucose-Capillary 424 (*) 70 - 99 mg/dL  BASIC METABOLIC PANEL     Status: Abnormal   Collection Time    07/27/13 10:43 PM      Result Value Range   Sodium 141  137 - 147 mEq/L   Potassium 3.5 (*) 3.7 - 5.3 mEq/L   Chloride 97  96 - 112 mEq/L   CO2 21  19 - 32 mEq/L   Glucose, Bld 386 (*) 70 - 99 mg/dL   BUN 47 (*) 6 - 23 mg/dL   Creatinine, Ser 1.53 (*) 0.50 - 1.10 mg/dL   Calcium 9.3  8.4 - 10.5 mg/dL  GFR calc non Af Amer 36 (*) >90 mL/min   GFR calc Af Amer 42 (*) >90 mL/min   Comment: (NOTE)     The eGFR has been calculated using the CKD EPI equation.     This calculation has not been validated in all clinical situations.     eGFR's persistently <90 mL/min signify possible Chronic Kidney     Disease.  GLUCOSE, CAPILLARY     Status: Abnormal   Collection Time    07/27/13 11:48 PM      Result Value Range   Glucose-Capillary 254 (*) 70 - 99 mg/dL  BASIC METABOLIC PANEL     Status: Abnormal   Collection Time    07/28/13 12:22 AM      Result Value Range   Sodium 141  137 - 147 mEq/L   Potassium 3.7  3.7 - 5.3 mEq/L   Chloride 100  96 - 112 mEq/L   CO2 24  19 - 32 mEq/L   Glucose, Bld 239 (*) 70 - 99 mg/dL   BUN 47 (*) 6 - 23 mg/dL   Creatinine, Ser 1.67 (*) 0.50 - 1.10 mg/dL   Calcium 9.3  8.4 - 10.5 mg/dL   GFR calc non Af Amer 33 (*) >90 mL/min   GFR calc Af Amer 38 (*) >90 mL/min   Comment: (NOTE)     The  eGFR has been calculated using the CKD EPI equation.     This calculation has not been validated in all clinical situations.     eGFR's persistently <90 mL/min signify possible Chronic Kidney     Disease.  GLUCOSE, CAPILLARY     Status: Abnormal   Collection Time    07/28/13  1:01 AM      Result Value Range   Glucose-Capillary 214 (*) 70 - 99 mg/dL  GLUCOSE, CAPILLARY     Status: Abnormal   Collection Time    07/28/13  1:59 AM      Result Value Range   Glucose-Capillary 151 (*) 70 - 99 mg/dL  GLUCOSE, CAPILLARY     Status: Abnormal   Collection Time    07/28/13  3:10 AM      Result Value Range   Glucose-Capillary 155 (*) 70 - 99 mg/dL  CBC     Status: None   Collection Time    07/28/13  4:09 AM      Result Value Range   WBC 9.1  4.0 - 10.5 K/uL   RBC 4.40  3.87 - 5.11 MIL/uL   Hemoglobin 13.1  12.0 - 15.0 g/dL   HCT 37.4  36.0 - 46.0 %   MCV 85.0  78.0 - 100.0 fL   MCH 29.8  26.0 - 34.0 pg   MCHC 35.0  30.0 - 36.0 g/dL   RDW 14.8  11.5 - 15.5 %   Platelets 221  150 - 400 K/uL  COMPREHENSIVE METABOLIC PANEL     Status: Abnormal   Collection Time    07/28/13  4:09 AM      Result Value Range   Sodium 145  137 - 147 mEq/L   Potassium 3.5 (*) 3.7 - 5.3 mEq/L   Chloride 101  96 - 112 mEq/L   CO2 28  19 - 32 mEq/L   Glucose, Bld 120 (*) 70 - 99 mg/dL   BUN 46 (*) 6 - 23 mg/dL   Creatinine, Ser 1.72 (*) 0.50 - 1.10 mg/dL   Calcium 9.1  8.4 - 10.5 mg/dL  Total Protein 7.2  6.0 - 8.3 g/dL   Albumin 3.2 (*) 3.5 - 5.2 g/dL   AST 14  0 - 37 U/L   ALT 18  0 - 35 U/L   Alkaline Phosphatase 100  39 - 117 U/L   Total Bilirubin 0.3  0.3 - 1.2 mg/dL   GFR calc non Af Amer 32 (*) >90 mL/min   GFR calc Af Amer 37 (*) >90 mL/min   Comment: (NOTE)     The eGFR has been calculated using the CKD EPI equation.     This calculation has not been validated in all clinical situations.     eGFR's persistently <90 mL/min signify possible Chronic Kidney     Disease.  GLUCOSE, CAPILLARY      Status: Abnormal   Collection Time    07/28/13  4:09 AM      Result Value Range   Glucose-Capillary 129 (*) 70 - 99 mg/dL  HEMOGLOBIN A1C     Status: Abnormal   Collection Time    07/28/13  4:09 AM      Result Value Range   Hemoglobin A1C 18.8 (*) <5.7 %   Comment: (NOTE)                                                                               According to the ADA Clinical Practice Recommendations for 2011, when     HbA1c is used as a screening test:      >=6.5%   Diagnostic of Diabetes Mellitus               (if abnormal result is confirmed)     5.7-6.4%   Increased risk of developing Diabetes Mellitus     References:Diagnosis and Classification of Diabetes Mellitus,Diabetes     HFWY,6378,58(IFOYD 1):S62-S69 and Standards of Medical Care in             Diabetes - 2011,Diabetes Care,2011,34 (Suppl 1):S11-S61.   Mean Plasma Glucose 493 (*) <117 mg/dL   Comment: Performed at [place], CAPILLARY     Status: Abnormal   Collection Time    07/28/13  4:57 AM      Result Value Range   Glucose-Capillary 123 (*) 70 - 99 mg/dL  GLUCOSE, CAPILLARY     Status: Abnormal   Collection Time    07/28/13  7:34 AM      Result Value Range   Glucose-Capillary 196 (*) 70 - 99 mg/dL  GLUCOSE, CAPILLARY     Status: Abnormal   Collection Time    07/28/13  8:30 AM      Result Value Range   Glucose-Capillary 185 (*) 70 - 99 mg/dL  GLUCOSE, CAPILLARY     Status: Abnormal   Collection Time    07/28/13  9:38 AM      Result Value Range   Glucose-Capillary 221 (*) 70 - 99 mg/dL   Comment 1 Documented in Chart     Comment 2 Notify RN    GLUCOSE, CAPILLARY     Status: Abnormal   Collection Time    07/28/13 11:01 AM      Result Value Range   Glucose-Capillary 208 (*)  70 - 99 mg/dL   Comment 1 Documented in Chart     Comment 2 Notify RN    GLUCOSE, CAPILLARY     Status: Abnormal   Collection Time    07/28/13  3:59 PM      Result Value Range   Glucose-Capillary 349 (*) 70 -  99 mg/dL  GLUCOSE, CAPILLARY     Status: Abnormal   Collection Time    07/28/13  8:01 PM      Result Value Range   Glucose-Capillary 460 (*) 70 - 99 mg/dL   Comment 1 Notify RN    GLUCOSE, RANDOM     Status: Abnormal   Collection Time    07/28/13  8:23 PM      Result Value Range   Glucose, Bld 521 (*) 70 - 99 mg/dL  GLUCOSE, CAPILLARY     Status: Abnormal   Collection Time    07/28/13 11:44 PM      Result Value Range   Glucose-Capillary 422 (*) 70 - 99 mg/dL   Comment 1 Notify RN    GLUCOSE, CAPILLARY     Status: Abnormal   Collection Time    07/29/13  4:14 AM      Result Value Range   Glucose-Capillary 399 (*) 70 - 99 mg/dL   Comment 1 Notify RN    GLUCOSE, CAPILLARY     Status: Abnormal   Collection Time    07/29/13  7:43 AM      Result Value Range   Glucose-Capillary 280 (*) 70 - 99 mg/dL    Ct Head Wo Contrast  07/27/2013   CLINICAL DATA:  Left arm weakness and numbness ; slurred speech and gait disturbance  EXAM: CT HEAD WITHOUT CONTRAST  TECHNIQUE: Contiguous axial images were obtained from the base of the skull through the vertex without intravenous contrast. Study was obtained within 24 hr of patient's arrival at the emergency department.  COMPARISON:  None.  FINDINGS: There is age related volume loss. There is no mass, hemorrhage, extra-axial fluid collection, or midline shift. The gray and white compartments appear normal. No acute infarct is appreciable. Bony calvarium appears intact. The mastoid air cells are clear.  IMPRESSION: Age related volume loss. No intracranial mass, hemorrhage, or acute infarct.   Electronically Signed   By: Lowella Grip M.D.   On: 07/27/2013 15:23        Lorey Pallett A. Merlene Laughter, M.D.  Diplomate, Tax adviser of Psychiatry and Neurology ( Neurology). 07/29/2013, 9:55 AM

## 2013-07-29 NOTE — Progress Notes (Signed)
Subjective: She says she feels okay. Her blood sugar is 200 this morning but reached the 500s last night. I told her she needs to learn to give herself insulin and to have Accu-Cheks etc.  Objective: Vital signs in last 24 hours: Temp:  [97.5 F (36.4 C)-97.7 F (36.5 C)] 97.5 F (36.4 C) (01/15 0415) Pulse Rate:  [84-96] 84 (01/15 0415) Resp:  [15-22] 18 (01/15 0415) BP: (107-118)/(65-85) 107/69 mmHg (01/15 0415) SpO2:  [98 %-100 %] 100 % (01/15 0415) Weight change:  Last BM Date: 07/28/13  Intake/Output from previous day: 01/14 0701 - 01/15 0700 In: 2040 [P.O.:240; I.V.:1800] Out: 200 [Urine:200]  PHYSICAL EXAM General appearance: alert, cooperative, no distress and morbidly obese Resp: clear to auscultation bilaterally Cardio: regular rate and rhythm, S1, S2 normal, no murmur, click, rub or gallop GI: soft, non-tender; bowel sounds normal; no masses,  no organomegaly Extremities: extremities normal, atraumatic, no cyanosis or edema  Lab Results:    Basic Metabolic Panel:  Recent Labs  16/10/96 0022 07/28/13 0409 07/28/13 2023  NA 141 145  --   K 3.7 3.5*  --   CL 100 101  --   CO2 24 28  --   GLUCOSE 239* 120* 521*  BUN 47* 46*  --   CREATININE 1.67* 1.72*  --   CALCIUM 9.3 9.1  --    Liver Function Tests:  Recent Labs  07/27/13 1548 07/28/13 0409  AST 13 14  ALT 21 18  ALKPHOS 153* 100  BILITOT 0.3 0.3  PROT 7.8 7.2  ALBUMIN 3.6 3.2*   No results found for this basename: LIPASE, AMYLASE,  in the last 72 hours No results found for this basename: AMMONIA,  in the last 72 hours CBC:  Recent Labs  07/27/13 1547 07/27/13 1931 07/28/13 0409  WBC 8.5 8.2 9.1  NEUTROABS 7.0  --   --   HGB 14.5 13.6 13.1  HCT 44.6 40.9 37.4  MCV 93.5 90.1 85.0  PLT 251 231 221   Cardiac Enzymes:  Recent Labs  07/27/13 1548  TROPONINI <0.30   BNP: No results found for this basename: PROBNP,  in the last 72 hours D-Dimer: No results found for this  basename: DDIMER,  in the last 72 hours CBG:  Recent Labs  07/28/13 1101 07/28/13 1559 07/28/13 2001 07/28/13 2344 07/29/13 0414 07/29/13 0743  GLUCAP 208* 349* 460* 422* 399* 280*   Hemoglobin A1C:  Recent Labs  07/28/13 0409  HGBA1C 18.8*   Fasting Lipid Panel: No results found for this basename: CHOL, HDL, LDLCALC, TRIG, CHOLHDL, LDLDIRECT,  in the last 72 hours Thyroid Function Tests: No results found for this basename: TSH, T4TOTAL, FREET4, T3FREE, THYROIDAB,  in the last 72 hours Anemia Panel: No results found for this basename: VITAMINB12, FOLATE, FERRITIN, TIBC, IRON, RETICCTPCT,  in the last 72 hours Coagulation:  Recent Labs  07/27/13 1548  LABPROT 13.8  INR 1.08   Urine Drug Screen: Drugs of Abuse  No results found for this basename: labopia, cocainscrnur, labbenz, amphetmu, thcu, labbarb    Alcohol Level: No results found for this basename: ETH,  in the last 72 hours Urinalysis: No results found for this basename: COLORURINE, APPERANCEUR, LABSPEC, PHURINE, GLUCOSEU, HGBUR, BILIRUBINUR, KETONESUR, PROTEINUR, UROBILINOGEN, NITRITE, LEUKOCYTESUR,  in the last 72 hours Misc. Labs:  ABGS No results found for this basename: PHART, PCO2, PO2ART, TCO2, HCO3,  in the last 72 hours CULTURES Recent Results (from the past 240 hour(s))  MRSA PCR SCREENING  Status: None   Collection Time    07/27/13  6:36 PM      Result Value Range Status   MRSA by PCR NEGATIVE  NEGATIVE Final   Comment:            The GeneXpert MRSA Assay (FDA     approved for NASAL specimens     only), is one component of a     comprehensive MRSA colonization     surveillance program. It is not     intended to diagnose MRSA     infection nor to guide or     monitor treatment for     MRSA infections.   Studies/Results: Ct Head Wo Contrast  07/27/2013   CLINICAL DATA:  Left arm weakness and numbness ; slurred speech and gait disturbance  EXAM: CT HEAD WITHOUT CONTRAST  TECHNIQUE:  Contiguous axial images were obtained from the base of the skull through the vertex without intravenous contrast. Study was obtained within 24 hr of patient's arrival at the emergency department.  COMPARISON:  None.  FINDINGS: There is age related volume loss. There is no mass, hemorrhage, extra-axial fluid collection, or midline shift. The gray and white compartments appear normal. No acute infarct is appreciable. Bony calvarium appears intact. The mastoid air cells are clear.  IMPRESSION: Age related volume loss. No intracranial mass, hemorrhage, or acute infarct.   Electronically Signed   By: Bretta BangWilliam  Woodruff M.D.   On: 07/27/2013 15:23    Medications:  Prior to Admission:  Prescriptions prior to admission  Medication Sig Dispense Refill  . amLODipine (NORVASC) 5 MG tablet Take 5 mg by mouth every morning.      Marland Kitchen. amoxicillin (AMOXIL) 500 MG capsule Take 500 mg by mouth 3 (three) times daily. 10 day course starting on 07/22/2013      . benazepril (LOTENSIN) 20 MG tablet Take 20 mg by mouth 2 (two) times daily.      Marland Kitchen. doxycycline (VIBRAMYCIN) 100 MG capsule Take 100 mg by mouth 2 (two) times daily. *may take up to twice daily*      . furosemide (LASIX) 40 MG tablet Take 40 mg by mouth every morning.      . hydrochlorothiazide (MICROZIDE) 12.5 MG capsule Take 12.5 mg by mouth 2 (two) times daily.      . Multiple Vitamins-Minerals (CENTRUM SILVER ULTRA WOMENS) TABS Take 1 tablet by mouth every morning.       Scheduled: . sodium chloride   Intravenous Once  . docusate sodium  100 mg Oral BID  . heparin  5,000 Units Subcutaneous Q8H  . insulin aspart  0-24 Units Subcutaneous Q4H  . insulin aspart  15 Units Subcutaneous TID WC  . insulin detemir  60 Units Subcutaneous QHS  . nystatin   Topical BID  . sodium chloride  1,000 mL Intravenous Once   Continuous: . sodium chloride 150 mL/hr at 07/28/13 0800  . dextrose 5 % and 0.45% NaCl     ZOX:[id number]:acetaminophen, dextrose  Assesment: She has new  onset diabetes with DKA. She has hypertension which is a long-standing problem. She has obesity and lost weight but I think it's actually related to her hyperglycemia. Her blood sugar is still up. Active Problems:   DKA (diabetic ketoacidoses)   HTN (hypertension)   Obesity    Plan: She will continue treatments. Endocrinology consult noted and appreciated    LOS: 2 days   Dakari Stabler L 07/29/2013, 8:29 AM

## 2013-07-29 NOTE — Progress Notes (Signed)
Notified MD of patient's blood sugar- 460.  Will follow MD's orders and continue to monitor.

## 2013-07-29 NOTE — Progress Notes (Signed)
Subjective: F/U for newly diagnosed uncontrolled type 2 DM.  She is out of ICU. She has had hyperglycemia after her insulin drip was discontinued.  Objective: She looks better. No distress. Eating well.  Vital signs in last 24 hours: Filed Vitals:   07/28/13 1100 07/28/13 2002 07/29/13 0133 07/29/13 0415  BP: 111/73 118/69 108/67 107/69  Pulse:  96 89 84  Temp:  97.6 F (36.4 C) 97.7 F (36.5 C) 97.5 F (36.4 C)  TempSrc:  Oral Oral Oral  Resp: 16 18 20 18   Height:      Weight:      SpO2:  98% 100% 100%    Intake/Output Summary (Last 24 hours) at 07/29/13 0800 Last data filed at 07/28/13 1900  Gross per 24 hour  Intake   1890 ml  Output      0 ml  Net   1890 ml     Gen: not in acute distress.  HEENT: well hydrated. Neck: thyroid palpable , but not significantly enlarged.  Chest : CTAB. CVS: Distant heart sounds.  Abd; soft , non tender.  Ext: no edema.  CNS: non focal sensory and motor exam  Skin: no rash, nor hyperemia.   Lab Results: Basic Metabolic Panel:  Recent Labs  25/36/64 0022 07/28/13 0409 07/28/13 2023  NA 141 145  --   K 3.7 3.5*  --   CL 100 101  --   CO2 24 28  --   GLUCOSE 239* 120* 521*  BUN 47* 46*  --   CREATININE 1.67* 1.72*  --   CALCIUM 9.3 9.1  --    Liver Function Tests:  Recent Labs  07/27/13 1548 07/28/13 0409  AST 13 14  ALT 21 18  ALKPHOS 153* 100  BILITOT 0.3 0.3  PROT 7.8 7.2  ALBUMIN 3.6 3.2*   No results found for this basename: LIPASE, AMYLASE,  in the last 72 hours CBC:  Recent Labs  07/27/13 1547 07/27/13 1931 07/28/13 0409  WBC 8.5 8.2 9.1  NEUTROABS 7.0  --   --   HGB 14.5 13.6 13.1  HCT 44.6 40.9 37.4  MCV 93.5 90.1 85.0  PLT 251 231 221   Cardiac Enzymes:  Recent Labs  07/27/13 1548  TROPONINI <0.30   BNP: No components found with this basename: POCBNP,  D-Dimer: No results found for this basename: DDIMER,  in the last 72 hours CBG:  Recent Labs  07/28/13 1101 07/28/13 1559  07/28/13 2001 07/28/13 2344 07/29/13 0414 07/29/13 0743  GLUCAP 208* 349* 460* 422* 399* 280*   Hemoglobin A1C:  Recent Labs  07/28/13 0409  HGBA1C 18.8*    Studies/Results: Ct Head Wo Contrast  07/27/2013   CLINICAL DATA:  Left arm weakness and numbness ; slurred speech and gait disturbance  EXAM: CT HEAD WITHOUT CONTRAST  TECHNIQUE: Contiguous axial images were obtained from the base of the skull through the vertex without intravenous contrast. Study was obtained within 24 hr of patient's arrival at the emergency department.  COMPARISON:  None.  FINDINGS: There is age related volume loss. There is no mass, hemorrhage, extra-axial fluid collection, or midline shift. The gray and white compartments appear normal. No acute infarct is appreciable. Bony calvarium appears intact. The mastoid air cells are clear.  IMPRESSION: Age related volume loss. No intracranial mass, hemorrhage, or acute infarct.   Electronically Signed   By: Bretta Bang M.D.   On: 07/27/2013 15:23     Medications:  Scheduled Meds: . sodium  chloride   Intravenous Once  . docusate sodium  100 mg Oral BID  . heparin  5,000 Units Subcutaneous Q8H  . insulin aspart  0-24 Units Subcutaneous Q4H  . insulin aspart  15 Units Subcutaneous TID WC  . insulin detemir  60 Units Subcutaneous QHS  . nystatin   Topical BID  . sodium chloride  1,000 mL Intravenous Once   Continuous Infusions: . sodium chloride 150 mL/hr at 07/28/13 0800  . dextrose 5 % and 0.45% NaCl     PRN Meds:.acetaminophen, dextrose  Assessment/Plan: Active Problems:   DKA (diabetic ketoacidoses)   HTN (hypertension)   Obesity   Plan: She has had a rebound hyperglycemia, mainly due to premature switch off her insulin drip. Given her extremenly high a1c of 18.8% , she will need a larger dose of insulin to achieve reasonable glycemic control before discharge.  she will need to stay on intensive basal/bolus insulin. I will increase her Levemir to  60 units qhs,  And her  Novologto 15 units TIDAC plus SSI, associated with monitoring of BG ac and HS.  Her bedside diabetes education is pending  in anticipation of discharge on insulin.  She has abnormal renal function , not clear if it is chronic. We can not initiate safe oral medications now.  In the long run, patient remains high risk for CAD, CVA,retinopathy, and neuropathy.  These are all discussed with the patient.  I have counseled the patient on diet management and weight loss, by adopting a carbohydrate restricted diet. More education to be done as out patient in 1 week.  Thank you for involving me in the care of this patient, and I will continue to follow her up with you to adjust her insulin in house and as out patient.  LOS: 2 days   Karen Rios 07/29/2013, 8:00 AM

## 2013-07-30 LAB — GLUCOSE, CAPILLARY
GLUCOSE-CAPILLARY: 180 mg/dL — AB (ref 70–99)
Glucose-Capillary: 145 mg/dL — ABNORMAL HIGH (ref 70–99)
Glucose-Capillary: 159 mg/dL — ABNORMAL HIGH (ref 70–99)
Glucose-Capillary: 307 mg/dL — ABNORMAL HIGH (ref 70–99)
Glucose-Capillary: 345 mg/dL — ABNORMAL HIGH (ref 70–99)
Glucose-Capillary: 201 mg/dL — ABNORMAL HIGH (ref 70–99)
Glucose-Capillary: 195 mg/dL — ABNORMAL HIGH (ref 70–99)
Glucose-Capillary: 190 mg/dL — ABNORMAL HIGH (ref 70–99)

## 2013-07-30 NOTE — Progress Notes (Signed)
Patient ID: Karen Rios, female   DOB: 1955/12/21, 58 y.o.   MRN: [id number]  [facility]UR[facility] Lorea Kupfer A. Gerilyn Pilgrim, MD     [email or web address]          Karen Rios is an 58 y.o. female.   Assessment/Plan: 1. Episodic flexor spasms of the left hand and forearm due to severe hyperglycemia. This is improving. No evidence of seizures or carotid disease. I suspect that the patient should continue to improve. Sign off reconsult as needed.  The patient reports that she is feeling better. She reports improvement overall in her medical condition. She reports less of the flexor spasms of the left upper extremity.  She is awake and alert. She is lucid and coherent. She still has mild pronator drift of the left upper extremity. No flexor spasms are noted today with handgrip although the grip is weak 4/5. Deltoid 5.    Objective: Vital signs in last 24 hours: Temp:  [97.5 F (36.4 C)-98 F (36.7 C)] 98 F (36.7 C) (01/16 1349) Pulse Rate:  [86-107] 95 (01/16 1349) Resp:  [18-20] 18 (01/16 1349) BP: (118-137)/(80-94) 122/80 mmHg (01/16 1349) SpO2:  [94 %-100 %] 100 % (01/16 1349)  Intake/Output from previous day: 01/15 0701 - 01/16 0700 In: 720 [P.O.:720] Out: -  Intake/Output this shift: Total I/O In: 480 [P.O.:480] Out: 50 [Urine:50] Nutritional status: Carb Control   Lab Results: Results for orders placed during the hospital encounter of 07/27/13 (from the past 48 hour(s))  GLUCOSE, CAPILLARY     Status: Abnormal   Collection Time    07/28/13  3:59 PM      Result Value Range   Glucose-Capillary 349 (*) 70 - 99 mg/dL  GLUCOSE, CAPILLARY     Status: Abnormal   Collection Time    07/28/13  8:01 PM      Result Value Range   Glucose-Capillary 460 (*) 70 - 99 mg/dL   Comment 1 Notify RN    GLUCOSE, RANDOM     Status: Abnormal   Collection Time    07/28/13  8:23 PM      Result Value Range   Glucose, Bld 521 (*) 70 - 99 mg/dL  GLUCOSE, CAPILLARY     Status:  Abnormal   Collection Time    07/28/13 11:44 PM      Result Value Range   Glucose-Capillary 422 (*) 70 - 99 mg/dL   Comment 1 Notify RN    GLUCOSE, CAPILLARY     Status: Abnormal   Collection Time    07/29/13  4:14 AM      Result Value Range   Glucose-Capillary 399 (*) 70 - 99 mg/dL   Comment 1 Notify RN    GLUCOSE, CAPILLARY     Status: Abnormal   Collection Time    07/29/13  7:43 AM      Result Value Range   Glucose-Capillary 280 (*) 70 - 99 mg/dL  GLUCOSE, CAPILLARY     Status: Abnormal   Collection Time    07/29/13 11:42 AM      Result Value Range   Glucose-Capillary 297 (*) 70 - 99 mg/dL  URINALYSIS, ROUTINE W REFLEX MICROSCOPIC     Status: Abnormal   Collection Time    07/29/13  4:15 PM      Result Value Range   Color, Urine YELLOW  YELLOW   APPearance CLOUDY (*) CLEAR   Specific Gravity, Urine 1.015  1.005 - 1.030   pH 5.5  5.0 -  8.0   Glucose, UA >1000 (*) NEGATIVE mg/dL   Hgb urine dipstick TRACE (*) NEGATIVE   Bilirubin Urine NEGATIVE  NEGATIVE   Ketones, ur NEGATIVE  NEGATIVE mg/dL   Protein, ur NEGATIVE  NEGATIVE mg/dL   Urobilinogen, UA 0.2  0.0 - 1.0 mg/dL   Nitrite NEGATIVE  NEGATIVE   Leukocytes, UA TRACE (*) NEGATIVE  URINE MICROSCOPIC-ADD ON     Status: Abnormal   Collection Time    07/29/13  4:15 PM      Result Value Range   Squamous Epithelial / LPF FEW (*) RARE   WBC, UA 21-50  <3 WBC/hpf   RBC / HPF 0-2  <3 RBC/hpf   Bacteria, UA FEW (*) RARE   Urine-Other RARE YEAST    GLUCOSE, CAPILLARY     Status: Abnormal   Collection Time    07/29/13  4:47 PM      Result Value Range   Glucose-Capillary 307 (*) 70 - 99 mg/dL  GLUCOSE, CAPILLARY     Status: Abnormal   Collection Time    07/29/13  8:27 PM      Result Value Range   Glucose-Capillary 345 (*) 70 - 99 mg/dL   Comment 1 Notify RN    GLUCOSE, CAPILLARY     Status: Abnormal   Collection Time    07/30/13 12:00 AM      Result Value Range   Glucose-Capillary 159 (*) 70 - 99 mg/dL   Comment  1 Notify RN    GLUCOSE, CAPILLARY     Status: Abnormal   Collection Time    07/30/13  4:21 AM      Result Value Range   Glucose-Capillary 145 (*) 70 - 99 mg/dL   Comment 1 Notify RN    GLUCOSE, CAPILLARY     Status: Abnormal   Collection Time    07/30/13  8:15 AM      Result Value Range   Glucose-Capillary 180 (*) 70 - 99 mg/dL   Comment 1 Documented in Chart     Comment 2 Notify RN    GLUCOSE, CAPILLARY     Status: Abnormal   Collection Time    07/30/13 11:22 AM      Result Value Range   Glucose-Capillary 201 (*) 70 - 99 mg/dL   Comment 1 Notify RN     Comment 2 Documented in Chart      Lipid Panel No results found for this basename: CHOL, TRIG, HDL, CHOLHDL, VLDL, LDLCALC,  in the last 72 hours  Studies/Results: US Carotid Duplex Bilateral  07/29/2013   CLINICAL DATA:  Left-sided weakness, diabetes, hypertension and hyperlipidemia.  EXAM: BILATERAL CAROTID DUPLEX ULTRASOUND  TECHNIQUE: Wallace Cullens scale imaging, color Doppler and duplex ultrasound were performed of bilateral carotid and vertebral arteries in the neck.  COMPARISON:  None.  FINDINGS: Criteria: Quantification of carotid stenosis is based on velocity parameters that correlate the residual internal carotid diameter with NASCET-based stenosis levels, using the diameter of the distal internal carotid lumen as the denominator for stenosis measurement.  The following velocity measurements were obtained:  RIGHT  ICA:  60/21 cm/sec  CCA:  74/17 cm/sec  SYSTOLIC ICA/CCA RATIO:  0.8  DIASTOLIC ICA/CCA RATIO:  1.2  ECA:  102 cm/sec  LEFT  ICA:  129/38 cm/sec  CCA:  95/19 cm/sec  SYSTOLIC ICA/CCA RATIO:  1.4  DIASTOLIC ICA/CCA RATIO:  2.0  ECA:  72 cm/sec  RIGHT CAROTID ARTERY: Mild intimal thickening present. No focal plaque is identified.  The ICA is tortuous. There is no evidence of carotid stenosis.  RIGHT VERTEBRAL ARTERY:  Antegrade flow with normal waveform.  LEFT CAROTID ARTERY: Intimal thickening present. The ICA is tortuous. No  focal plaque is identified. There is no evidence of carotid stenosis. The mildly elevated velocity of 129 cm/second was obtained in a tortuous segment. Proximal velocity is 55 centimeters/second.  LEFT VERTEBRAL ARTERY:  Antegrade flow with normal waveform.  IMPRESSION: Mild intimal thickening without evidence of focal plaque at either carotid bifurcation. There is no evidence of carotid stenosis. Both ICAs show moderate tortuosity, likely reflecting chronic hypertension.   Electronically Signed   By: Irish LackGlenn  Yamagata M.D.   On: 07/29/2013 12:07    Medications:  Scheduled Meds: . docusate sodium  100 mg Oral BID  . heparin  5,000 Units Subcutaneous Q8H  . insulin aspart  0-24 Units Subcutaneous Q4H  . insulin aspart  15 Units Subcutaneous TID WC  . insulin detemir  60 Units Subcutaneous QHS  . nystatin   Topical BID   Continuous Infusions: . sodium chloride 150 mL/hr at 07/28/13 0800  . dextrose 5 % and 0.45% NaCl     PRN Meds:.acetaminophen, dextrose     LOS: 3 days   Brannan Cassedy A. Gerilyn Pilgrimoonquah, M.D.  Diplomate, Biomedical engineerAmerican Board of Psychiatry and Neurology ( Neurology).

## 2013-07-30 NOTE — Procedures (Signed)
[facility] Sartaj Hoskin A. Gerilyn Pilgrimoonquah, MD     [email or web address]          FACILITY: [facility]  PHYSICIAN: Kista Robb A. Gerilyn Pilgrimoonquah, M.D.  DATE OF PROCEDURE: 07/29/2013 EEG INTERPRETATION  INDICATION: The patient is a 58 year old has frequent shaking and spasms of the left upper extremity. There is a history of severe hypoglycemia.  MEDICATIONS:  Scheduled Meds: . docusate sodium  100 mg Oral BID  . heparin  5,000 Units Subcutaneous Q8H  . insulin aspart  0-24 Units Subcutaneous Q4H  . insulin aspart  15 Units Subcutaneous TID WC  . insulin detemir  60 Units Subcutaneous QHS  . nystatin   Topical BID   Continuous Infusions: . sodium chloride 150 mL/hr at 07/28/13 0800  . dextrose 5 % and 0.45% NaCl     PRN Meds:.acetaminophen, dextrose    ANALYSIS: A 16-channel recording using standard 10/20 measurements is conducted for 20 minutes. There is a well-formed posterior dominant rhythm of 8-1/2 Hz which attenuates with eye opening. There is beta activity observed in the frontal areas. Awake and drowsy activities are observed. Photic stimulation and hyperventilation were not carried out. There is no focal or lateralized slowing. There is no epileptiform activity observed.   IMPRESSION:  This is a normal recording of the awake and drowsy states.  Mehak Roskelley A. Gerilyn Pilgrimoonquah, M.D. Diplomate, Biomedical engineerAmerican Board of Psychiatry and Neurology ( Neurology).

## 2013-07-30 NOTE — Progress Notes (Signed)
Subjective: She says she feels better. Her blood sugar is still high but improved. Her hemoglobin A1c was very high. She has been showing mild-to-moderate renal insufficiency in the office but her baseline was about 1.4 creatinine.  Objective: Vital signs in last 24 hours: Temp:  [97.5 F (36.4 C)-97.8 F (36.6 C)] 97.6 F (36.4 C) (01/16 0423) Pulse Rate:  [86-107] 86 (01/16 0423) Resp:  [18-20] 18 (01/16 0423) BP: (118-137)/(81-94) 126/87 mmHg (01/16 0423) SpO2:  [94 %-97 %] 96 % (01/16 0423) Weight change:  Last BM Date: 07/29/13  Intake/Output from previous day: 01/15 0701 - 01/16 0700 In: 720 [P.O.:720] Out: -   PHYSICAL EXAM General appearance: alert, cooperative, no distress and morbidly obese Resp: clear to auscultation bilaterally Cardio: regular rate and rhythm, S1, S2 normal, no murmur, click, rub or gallop GI: soft, non-tender; bowel sounds normal; no masses,  no organomegaly Extremities: extremities normal, atraumatic, no cyanosis or edema  Lab Results:    Basic Metabolic Panel:  Recent Labs  16/10/96 0022 07/28/13 0409 07/28/13 2023  NA 141 145  --   K 3.7 3.5*  --   CL 100 101  --   CO2 24 28  --   GLUCOSE 239* 120* 521*  BUN 47* 46*  --   CREATININE 1.67* 1.72*  --   CALCIUM 9.3 9.1  --    Liver Function Tests:  Recent Labs  07/27/13 1548 07/28/13 0409  AST 13 14  ALT 21 18  ALKPHOS 153* 100  BILITOT 0.3 0.3  PROT 7.8 7.2  ALBUMIN 3.6 3.2*   No results found for this basename: LIPASE, AMYLASE,  in the last 72 hours No results found for this basename: AMMONIA,  in the last 72 hours CBC:  Recent Labs  07/27/13 1547 07/27/13 1931 07/28/13 0409  WBC 8.5 8.2 9.1  NEUTROABS 7.0  --   --   HGB 14.5 13.6 13.1  HCT 44.6 40.9 37.4  MCV 93.5 90.1 85.0  PLT 251 231 221   Cardiac Enzymes:  Recent Labs  07/27/13 1548  TROPONINI <0.30   BNP: No results found for this basename: PROBNP,  in the last 72 hours D-Dimer: No results  found for this basename: DDIMER,  in the last 72 hours CBG:  Recent Labs  07/28/13 1559 07/28/13 2001 07/28/13 2344 07/29/13 0414 07/29/13 0743 07/29/13 1142  GLUCAP 349* 460* 422* 399* 280* 297*   Hemoglobin A1C:  Recent Labs  07/28/13 0409  HGBA1C 18.8*   Fasting Lipid Panel: No results found for this basename: CHOL, HDL, LDLCALC, TRIG, CHOLHDL, LDLDIRECT,  in the last 72 hours Thyroid Function Tests: No results found for this basename: TSH, T4TOTAL, FREET4, T3FREE, THYROIDAB,  in the last 72 hours Anemia Panel: No results found for this basename: VITAMINB12, FOLATE, FERRITIN, TIBC, IRON, RETICCTPCT,  in the last 72 hours Coagulation:  Recent Labs  07/27/13 1548  LABPROT 13.8  INR 1.08   Urine Drug Screen: Drugs of Abuse  No results found for this basename: labopia, cocainscrnur, labbenz, amphetmu, thcu, labbarb    Alcohol Level: No results found for this basename: ETH,  in the last 72 hours Urinalysis:  Recent Labs  07/29/13 1615  COLORURINE YELLOW  LABSPEC 1.015  PHURINE 5.5  GLUCOSEU >1000*  HGBUR TRACE*  BILIRUBINUR NEGATIVE  KETONESUR NEGATIVE  PROTEINUR NEGATIVE  UROBILINOGEN 0.2  NITRITE NEGATIVE  LEUKOCYTESUR TRACE*   Misc. Labs:  ABGS No results found for this basename: PHART, PCO2, PO2ART, TCO2, HCO3,  in the last 72 hours CULTURES Recent Results (from the past 240 hour(s))  MRSA PCR SCREENING     Status: None   Collection Time    07/27/13  6:36 PM      Result Value Range Status   MRSA by PCR NEGATIVE  NEGATIVE Final   Comment:            The GeneXpert MRSA Assay (FDA     approved for NASAL specimens     only), is one component of a     comprehensive MRSA colonization     surveillance program. It is not     intended to diagnose MRSA     infection nor to guide or     monitor treatment for     MRSA infections.   Studies/Results: [place] Carotid Duplex Bilateral  07/29/2013   CLINICAL DATA:  Left-sided weakness, diabetes,  hypertension and hyperlipidemia.  EXAM: BILATERAL CAROTID DUPLEX ULTRASOUND  TECHNIQUE: Wallace CullensGray scale imaging, color Doppler and duplex ultrasound were performed of bilateral carotid and vertebral arteries in the neck.  COMPARISON:  None.  FINDINGS: Criteria: Quantification of carotid stenosis is based on velocity parameters that correlate the residual internal carotid diameter with NASCET-based stenosis levels, using the diameter of the distal internal carotid lumen as the denominator for stenosis measurement.  The following velocity measurements were obtained:  RIGHT  ICA:  60/21 cm/sec  CCA:  74/17 cm/sec  SYSTOLIC ICA/CCA RATIO:  0.8  DIASTOLIC ICA/CCA RATIO:  1.2  ECA:  102 cm/sec  LEFT  ICA:  129/38 cm/sec  CCA:  95/19 cm/sec  SYSTOLIC ICA/CCA RATIO:  1.4  DIASTOLIC ICA/CCA RATIO:  2.0  ECA:  72 cm/sec  RIGHT CAROTID ARTERY: Mild intimal thickening present. No focal plaque is identified. The ICA is tortuous. There is no evidence of carotid stenosis.  RIGHT VERTEBRAL ARTERY:  Antegrade flow with normal waveform.  LEFT CAROTID ARTERY: Intimal thickening present. The ICA is tortuous. No focal plaque is identified. There is no evidence of carotid stenosis. The mildly elevated velocity of 129 cm/second was obtained in a tortuous segment. Proximal velocity is 55 centimeters/second.  LEFT VERTEBRAL ARTERY:  Antegrade flow with normal waveform.  IMPRESSION: Mild intimal thickening without evidence of focal plaque at either carotid bifurcation. There is no evidence of carotid stenosis. Both ICAs show moderate tortuosity, likely reflecting chronic hypertension.   Electronically Signed   By: Irish LackGlenn  Yamagata M.D.   On: 07/29/2013 12:07    Medications:  Prior to Admission:  Prescriptions prior to admission  Medication Sig Dispense Refill  . amLODipine (NORVASC) 5 MG tablet Take 5 mg by mouth every morning.      Marland Kitchen. amoxicillin (AMOXIL) 500 MG capsule Take 500 mg by mouth 3 (three) times daily. 10 day course starting on  07/22/2013      . benazepril (LOTENSIN) 20 MG tablet Take 20 mg by mouth 2 (two) times daily.      Marland Kitchen. doxycycline (VIBRAMYCIN) 100 MG capsule Take 100 mg by mouth 2 (two) times daily. *may take up to twice daily*      . furosemide (LASIX) 40 MG tablet Take 40 mg by mouth every morning.      . hydrochlorothiazide (MICROZIDE) 12.5 MG capsule Take 12.5 mg by mouth 2 (two) times daily.      . Multiple Vitamins-Minerals (CENTRUM SILVER ULTRA WOMENS) TABS Take 1 tablet by mouth every morning.       Scheduled: . docusate sodium  100 mg Oral BID  .  heparin  5,000 Units Subcutaneous Q8H  . insulin aspart  0-24 Units Subcutaneous Q4H  . insulin aspart  15 Units Subcutaneous TID WC  . insulin detemir  60 Units Subcutaneous QHS  . nystatin   Topical BID   Continuous: . sodium chloride 150 mL/hr at 07/28/13 0800  . dextrose 5 % and 0.45% NaCl     ZOX:[id number], dextrose  Assesment: She was admitted with newly diagnosed diabetes with ketoacidosis. Her hemoglobin A1c is greater than 18. She has been losing weight but I think is related to uncontrolled hyperglycemia. She has hypertension which is been a chronic problem. She has morbid obesity which is been a chronic problem. She has had mild to moderate chronic renal failure which looks a little bit worse. She had acute neurological abnormalities and that seems to be related to her marked hyperglycemia Active Problems:   DKA (diabetic ketoacidoses)   HTN (hypertension)   Obesity    Plan: Continue current treatment for her blood sugar. She will have outpatient nephrology consultation repeat renal function testing in the morning. Her blood sugars are still too high for her to be discharged    LOS: 3 days   Roderick Calo L 07/30/2013, 7:57 AM

## 2013-07-30 NOTE — Progress Notes (Signed)
Subjective: F/U for newly diagnosed uncontrolled type 2 DM.  She is eating well. Her glycemic profile is improving.   Objective: She looks better. No distress.   Vital signs in last 24 hours: Filed Vitals:   07/29/13 1514 07/29/13 2029 07/29/13 2353 07/30/13 0423  BP: 137/94 122/81 118/82 126/87  Pulse: 107 94 96 86  Temp: 97.7 F (36.5 C) 97.8 F (36.6 C) 97.5 F (36.4 C) 97.6 F (36.4 C)  TempSrc: Oral Oral Oral Oral  Resp: 18 20 18 18   Height:      Weight:      SpO2: 94% 94% 97% 96%    Intake/Output Summary (Last 24 hours) at 07/30/13 1323 Last data filed at 07/29/13 1823  Gross per 24 hour  Intake    240 ml  Output      0 ml  Net    240 ml     Gen: not in acute distress.  HEENT: well hydrated. Neck: thyroid palpable , but not significantly enlarged.  Chest : CTAB. CVS: Distant heart sounds.  Abd; soft , non tender.  Ext: no edema.  CNS: non focal sensory and motor exam  Skin: no rash, nor hyperemia.   Lab Results: Basic Metabolic Panel:  Recent Labs  16/10/96 0022 07/28/13 0409 07/28/13 2023  NA 141 145  --   K 3.7 3.5*  --   CL 100 101  --   CO2 24 28  --   GLUCOSE 239* 120* 521*  BUN 47* 46*  --   CREATININE 1.67* 1.72*  --   CALCIUM 9.3 9.1  --    Liver Function Tests:  Recent Labs  07/27/13 1548 07/28/13 0409  AST 13 14  ALT 21 18  ALKPHOS 153* 100  BILITOT 0.3 0.3  PROT 7.8 7.2  ALBUMIN 3.6 3.2*   No results found for this basename: LIPASE, AMYLASE,  in the last 72 hours CBC:  Recent Labs  07/27/13 1547 07/27/13 1931 07/28/13 0409  WBC 8.5 8.2 9.1  NEUTROABS 7.0  --   --   HGB 14.5 13.6 13.1  HCT 44.6 40.9 37.4  MCV 93.5 90.1 85.0  PLT 251 231 221   Cardiac Enzymes:  Recent Labs  07/27/13 1548  TROPONINI <0.30   BNP: No components found with this basename: POCBNP,  D-Dimer: No results found for this basename: DDIMER,  in the last 72 hours CBG:  Recent Labs  07/29/13 1647 07/29/13 2027 07/30/13  07/30/13 0421 07/30/13 0815 07/30/13 1122  GLUCAP 307* 345* 159* 145* 180* 201*   Hemoglobin A1C:  Recent Labs  07/28/13 0409  HGBA1C 18.8*    Studies/Results: US Carotid Duplex Bilateral  07/29/2013   CLINICAL DATA:  Left-sided weakness, diabetes, hypertension and hyperlipidemia.  EXAM: BILATERAL CAROTID DUPLEX ULTRASOUND  TECHNIQUE: Wallace Cullens scale imaging, color Doppler and duplex ultrasound were performed of bilateral carotid and vertebral arteries in the neck.  COMPARISON:  None.  FINDINGS: Criteria: Quantification of carotid stenosis is based on velocity parameters that correlate the residual internal carotid diameter with NASCET-based stenosis levels, using the diameter of the distal internal carotid lumen as the denominator for stenosis measurement.  The following velocity measurements were obtained:  RIGHT  ICA:  60/21 cm/sec  CCA:  74/17 cm/sec  SYSTOLIC ICA/CCA RATIO:  0.8  DIASTOLIC ICA/CCA RATIO:  1.2  ECA:  102 cm/sec  LEFT  ICA:  129/38 cm/sec  CCA:  95/19 cm/sec  SYSTOLIC ICA/CCA RATIO:  1.4  DIASTOLIC ICA/CCA RATIO:  2.0  ECA:  72 cm/sec  RIGHT CAROTID ARTERY: Mild intimal thickening present. No focal plaque is identified. The ICA is tortuous. There is no evidence of carotid stenosis.  RIGHT VERTEBRAL ARTERY:  Antegrade flow with normal waveform.  LEFT CAROTID ARTERY: Intimal thickening present. The ICA is tortuous. No focal plaque is identified. There is no evidence of carotid stenosis. The mildly elevated velocity of 129 cm/second was obtained in a tortuous segment. Proximal velocity is 55 centimeters/second.  LEFT VERTEBRAL ARTERY:  Antegrade flow with normal waveform.  IMPRESSION: Mild intimal thickening without evidence of focal plaque at either carotid bifurcation. There is no evidence of carotid stenosis. Both ICAs show moderate tortuosity, likely reflecting chronic hypertension.   Electronically Signed   By: Irish LackGlenn  Yamagata M.D.   On: 07/29/2013 12:07     Medications:   Scheduled Meds: . docusate sodium  100 mg Oral BID  . heparin  5,000 Units Subcutaneous Q8H  . insulin aspart  0-24 Units Subcutaneous Q4H  . insulin aspart  15 Units Subcutaneous TID WC  . insulin detemir  60 Units Subcutaneous QHS  . nystatin   Topical BID   Continuous Infusions: . sodium chloride 150 mL/hr at 07/28/13 0800  . dextrose 5 % and 0.45% NaCl     PRN Meds:.acetaminophen, dextrose  Assessment/Plan: Active Problems:   DKA (diabetic ketoacidoses)   HTN (hypertension)   Obesity   Plan: Her glycemic profile is improving, and approaching target. Given her extremenly high a1c of 18.8% , she will continue to need  intensive basal/bolus insulin. I will continue Levemir  60 units qhs,  and  Novologto 15 units TIDAC plus SSI, associated with monitoring of BG ac and HS.  She may be discharge on same dose after  bed side diabetes education.   She has abnormal renal function , not clear if it is chronic. We can not initiate safe oral medications now.  In the long run, patient remains high risk for CAD, CVA,retinopathy, and neuropathy.  These are all discussed with the patient.  I have counseled the patient on diet management and weight loss, by adopting a carbohydrate restricted diet. More education to be done as out patient in 1 week.  Thank you for involving me in the care of this patient, and I will continue to follow her up with you to adjust her insulin in house and as out patient.  LOS: 3 days   Kolsen Choe 07/30/2013, 1:23 PM

## 2013-07-31 ENCOUNTER — Inpatient Hospital Stay (HOSPITAL_COMMUNITY): Payer: Medicare Other

## 2013-07-31 LAB — BASIC METABOLIC PANEL
BUN: 11 mg/dL (ref 6–23)
CALCIUM: 8.6 mg/dL (ref 8.4–10.5)
CO2: 25 meq/L (ref 19–32)
Chloride: 97 mEq/L (ref 96–112)
Creatinine, Ser: 0.61 mg/dL (ref 0.50–1.10)
GFR calc Af Amer: >90 mL/min (ref >90)
GFR calc non Af Amer: >90 mL/min (ref >90)
GLUCOSE: 256 mg/dL — AB (ref 70–99)
Potassium: 3.7 mEq/L (ref 3.7–5.3)
Sodium: 135 mEq/L — ABNORMAL LOW (ref 137–147)

## 2013-07-31 LAB — URINE CULTURE

## 2013-07-31 LAB — GLUCOSE, CAPILLARY
GLUCOSE-CAPILLARY: 221 mg/dL — AB (ref 70–99)
GLUCOSE-CAPILLARY: 236 mg/dL — AB (ref 70–99)
GLUCOSE-CAPILLARY: 227 mg/dL — AB (ref 70–99)
Glucose-Capillary: 171 mg/dL — ABNORMAL HIGH (ref 70–99)
Glucose-Capillary: 237 mg/dL — ABNORMAL HIGH (ref 70–99)
Glucose-Capillary: 181 mg/dL — ABNORMAL HIGH (ref 70–99)
Glucose-Capillary: 160 mg/dL — ABNORMAL HIGH (ref 70–99)

## 2013-07-31 MED ORDER — HYDROCODONE-ACETAMINOPHEN 5-325 MG PO TABS
1.0000 | ORAL_TABLET | ORAL | Status: DC | PRN
  Administered 2013-07-31 (×2): 1 via ORAL
  Filled 2013-07-31 (×2): qty 1

## 2013-07-31 MED ORDER — MAGNESIUM HYDROXIDE 400 MG/5ML PO SUSP
30.0000 mL | Freq: Every day | ORAL | Status: DC | PRN
Start: 2013-07-31 — End: 2013-08-01
  Administered 2013-07-31: 30 mL via ORAL
  Filled 2013-07-31: qty 30

## 2013-07-31 MED ORDER — LORAZEPAM 1 MG PO TABS
1.0000 mg | ORAL_TABLET | Freq: Once | ORAL | Status: AC
  Administered 2013-07-31: 1 mg via ORAL
  Filled 2013-07-31: qty 1

## 2013-07-31 MED ORDER — INSULIN ASPART 100 UNIT/ML ~~LOC~~ SOLN
18.0000 [IU] | Freq: Three times a day (TID) | SUBCUTANEOUS | Status: DC
  Administered 2013-07-31 – 2013-08-01 (×4): 18 [IU] via SUBCUTANEOUS

## 2013-07-31 MED ORDER — CIPROFLOXACIN HCL 250 MG PO TABS
250.0000 mg | ORAL_TABLET | Freq: Two times a day (BID) | ORAL | Status: DC
  Administered 2013-07-31 – 2013-08-01 (×3): 250 mg via ORAL
  Filled 2013-07-31 (×3): qty 1

## 2013-07-31 NOTE — Progress Notes (Signed)
07/31/13 1241 Patient c/o persistent discomfort, tingling to left hand, right hand occasionally. Also requested something for constipation. Notified Dr. Ouida SillsFagan. Orders received. Nursing to monitor. Earnstine RegalAshley Math Brazie, RN

## 2013-07-31 NOTE — Progress Notes (Signed)
07/31/13 1635 Patient has watched diabetes education videos over last couple of days. Reinforced diabetes education regarding when to check blood sugars, signs/symptoms of hyperglycemia, hypoglycemia, insulin injections. Patient stated would prefer to wait until her grandson is here to review insulin administration. Stated he will administer her insulin due to her tingling, discomfort to hands. Will reinforce diabetes education as needed. Earnstine RegalAshley Saige Busby, RN

## 2013-07-31 NOTE — Progress Notes (Signed)
Pt. Heard in room with rapid respirations. Pt. Stated her left hand was in a lot of pain and tingling.  Pt's speech was slurred and incomprehensible.  Pt's blood pressure was 209/133.  Put on 2L of oxygen and patient's breathing began to slow.  MD was notified and STAT CT of head was ordered.  Vitals were retaken and blood pressure dropped to 164/95.    Upon returning from radiology, patient stated that she possibly over reacted when her left hand started tingling and hurting.  Pt's speech was clear (baseline) by the time she returned to the room.  Will continue to monitor patient.

## 2013-08-01 LAB — GLUCOSE, CAPILLARY
GLUCOSE-CAPILLARY: 135 mg/dL — AB (ref 70–99)
GLUCOSE-CAPILLARY: 94 mg/dL (ref 70–99)
Glucose-Capillary: 150 mg/dL — ABNORMAL HIGH (ref 70–99)

## 2013-08-01 MED ORDER — INSULIN DETEMIR 100 UNIT/ML ~~LOC~~ SOLN: 60.0000 [IU] | Freq: Every day | SUBCUTANEOUS | Status: DC

## 2013-08-01 MED ORDER — INSULIN ASPART 100 UNIT/ML ~~LOC~~ SOLN: 18.0000 [IU] | Freq: Three times a day (TID) | SUBCUTANEOUS | Status: DC

## 2013-08-01 MED ORDER — CIPROFLOXACIN HCL 250 MG PO TABS: 250.0000 mg | ORAL_TABLET | Freq: Two times a day (BID) | ORAL | Status: DC

## 2013-08-01 NOTE — Discharge Summary (Signed)
NAME:  Karen Rios, Karen Rios              ACCOUNT NO.:  [id number]  MEDICAL RECORD NO.:  [id number]  LOCATION:  A315                          FACILITY:  [facility]  PHYSICIAN:  Kingsley Callanderoy O. Ouida SillsFagan, MD       DATE OF BIRTH:  [birth year]  DATE OF ADMISSION:  07/27/2013 DATE OF DISCHARGE:  01/18/2015LH                              DISCHARGE SUMMARY   DISCHARGE DIAGNOSES: 1. Diabetic ketoacidosis. 2. E. coli UTI. 3. Hypertension. 4. Hypokalemia.  HOSPITAL COURSE:  This patient is a 58 year old African-American female, who presented with marked hyperglycemia with a glucose of 1141.  She was acidotic with a bicarb of 50.  Her BUN and creatinine were elevated at 47 and 1.6.  She was treated with IV insulin.  She was treated with IV fluids.  Her hemoglobin A1c was elevated at 18.8.  She was seen in Endocrinology consultation.  She was ultimately modified to Levemir 60 units at bedtime with NovoLog 18 units before meals 3 times a day.  Her BUN and creatinine normalized to 11 and 0.61.  Her urinalysis revealed 21-50 WBCs.  Her urine culture revealed E. coli with greater than 100,000 colonies per mL of E. coli.  She was treated with Cipro and will continue her outpatient course for an additional 5 days.  Diabetes education was provided.  She was taught how to use a glucose meter.  She was instructed in the use of insulin.  Her grandson, however, will be the primary person giving her the insulin.  She was much improved and stable for discharge on the morning of the 18th.  She will be seen in followup by Dr. Juanetta GoslingHawkins in 1 week.  She will continue her antihypertensive regimen of amlodipine, benazepril, hydrochlorothiazide, and Lasix.  DISCHARGE MEDICATIONS:  Cipro 250 mg b.i.d. for 5 more days, Levemir 60 units at bedtime, NovoLog 18 units 3 times daily before meals, amlodipine 5 mg daily, benazepril 20 mg daily, Centrum Silver Ultra Women's multivitamin daily, furosemide 40 mg daily, hydrochlorothiazide 12.5  mg b.i.d.     Kingsley Callanderoy O. Ouida SillsFagan, MD     ROF/MEDQ  D:  08/01/2013  T:  08/01/2013  Job:  [id number]

## 2013-08-01 NOTE — Discharge Instructions (Signed)
Diabetes and Sick Day Management Blood sugar (glucose) can be more difficult to control when you are sick. Colds, fever, flu, nausea, vomiting, and diarrhea are all examples of common illnesses that can cause problems for people with diabetes. Loss of body fluids (dehydration) from fever, vomiting, diarrhea, infection, and the stress of a sickness can all cause blood glucose levels to increase. Because of this, it is very important to take your diabetes medicines and to eat some form of carbohydrate food when you are sick. Liquid or soft foods are often tolerated, and they help to replace fluids. HOME CARE INSTRUCTIONS These main guidelines are intended for managing a short-term (24 hours or less) sickness:  Take your usual dose of insulin or oral diabetes medicine. An exception would be if you take any form of metformin. If you cannot eat or drink, you can become dehydrated and should not take this medicine.  Continue to take your insulin even if you are unable to eat solid foods or are vomiting. Your insulin dose may stay the same, or it may need to be increased when you are sick.  You will need to test your blood glucose more often, generally every 2 4 hours. If you have type 1 diabetes, test your urine for ketones every 4 hours. If you have type 2 diabetes, test your urine for ketones as directed by your health care provider.  Eat some form of food that contains carbohydrates. The carbohydrates can be in solid or liquid form. You should eat 45 50 g of carbohydrates every 3 4 hours.  Replace fluids if you have a fever, vomit, or have diarrhea. Ask your health care provider for specific rehydration instructions.  Watch carefully for the signs of ketoacidosis if you have type 1 diabetes. Call your health care provider if any of the following symptoms are present, especially in children:  Moderate to large ketones in the urine along with a high blood glucose level.  Severe  nausea.  Vomiting.  Diarrhea.  Abdominal pain.  Rapid breathing.  Drink extra liquids that do not contain sugar, such as water or sugar-free liquids, or caffeine.  Be careful with over-the-counter medicines. Read the labels. They may contain sugar or types of sugars that can increase your blood glucose level. Food Choices for Illness All of the food choices below contain about 15 g of carbohydrates. Plan ahead and keep some of these foods around.    to  cup carbonated beverage containing sugar. Carbonated beverages will usually be better tolerated if they are opened and left at room temperature for a few minutes.   of a twin frozen ice pop.   cup regular gelatin.   cup juice.   cup ice cream or frozen yogurt.   cup cooked cereal.   cup sherbet.  1 cup clear broth or soup.  1 cup cream soup.   cup regular custard.   cup regular pudding.  1 cup sports drink.  1 cup plain yogurt.  1 slice toast.  6 squares saltine crackers.  5 vanilla wafers.   cup sports drink. SEEK MEDICAL CARE IF:   You are unable to drink fluids, even small amounts.  You have nausea and vomiting for more than 6 hours.  You have diarrhea for more than 6 hours.  Your blood glucose level is more than 240 mg/dL, even with additional insulin.  There is a change in mental status.  You develop an additional serious sickness.  You have been sick for 2  days and are not getting better.  You have had a fever for 2 days. SEEK IMMEDIATE MEDICAL CARE IF:  You have difficulty breathing.  You have moderate to large ketone levels. MAKE SURE YOU:  Understand these instructions.  Will watch your condition.  Will get help right away if you are not doing well or get worse. Document Released: 07/04/2003 Document Revised: 03/03/2013 Document Reviewed: 12/08/2012 [facility] Patient Information 2014 [place], [place].  Diabetes and Foot Care Diabetes may cause you to have problems  because of poor blood supply (circulation) to your feet and legs. This may cause the skin on your feet to become thinner, break easier, and heal more slowly. Your skin may become dry, and the skin may peel and crack. You may also have nerve damage in your legs and feet causing decreased feeling in them. You may not notice minor injuries to your feet that could lead to infections or more serious problems. Taking care of your feet is one of the most important things you can do for yourself.  HOME CARE INSTRUCTIONS  Wear shoes at all times, even in the house. Do not go barefoot. Bare feet are easily injured.  Check your feet daily for blisters, cuts, and redness. If you cannot see the bottom of your feet, use a mirror or ask someone for help.  Wash your feet with warm water (do not use hot water) and mild soap. Then pat your feet and the areas between your toes until they are completely dry. Do not soak your feet as this can dry your skin.  Apply a moisturizing lotion or petroleum jelly (that does not contain alcohol and is unscented) to the skin on your feet and to dry, brittle toenails. Do not apply lotion between your toes.  Trim your toenails straight across. Do not dig under them or around the cuticle. File the edges of your nails with an emery board or nail file.  Do not cut corns or calluses or try to remove them with medicine.  Wear clean socks or stockings every day. Make sure they are not too tight. Do not wear knee-high stockings since they may decrease blood flow to your legs.  Wear shoes that fit properly and have enough cushioning. To break in new shoes, wear them for just a few hours a day. This prevents you from injuring your feet. Always look in your shoes before you put them on to be sure there are no objects inside.  Do not cross your legs. This may decrease the blood flow to your feet.  If you find a minor scrape, cut, or break in the skin on your feet, keep it and the skin  around it clean and dry. These areas may be cleansed with mild soap and water. Do not cleanse the area with peroxide, alcohol, or iodine.  When you remove an adhesive bandage, be sure not to damage the skin around it.  If you have a wound, look at it several times a day to make sure it is healing.  Do not use heating pads or hot water bottles. They may burn your skin. If you have lost feeling in your feet or legs, you may not know it is happening until it is too late.  Make sure your health care provider performs a complete foot exam at least annually or more often if you have foot problems. Report any cuts, sores, or bruises to your health care provider immediately. SEEK MEDICAL CARE IF:   You  have an injury that is not healing.  You have cuts or breaks in the skin.  You have an ingrown nail.  You notice redness on your legs or feet.  You feel burning or tingling in your legs or feet.  You have pain or cramps in your legs and feet.  Your legs or feet are numb.  Your feet always feel cold. SEEK IMMEDIATE MEDICAL CARE IF:   There is increasing redness, swelling, or pain in or around a wound.  There is a red line that goes up your leg.  Pus is coming from a wound.  You develop a fever or as directed by your health care provider.  You notice a bad smell coming from an ulcer or wound. Document Released: 06/28/2000 Document Revised: 03/03/2013 Document Reviewed: 12/08/2012 [facility] Patient Information 2014 [place], [place].

## 2013-08-01 NOTE — Progress Notes (Signed)
08/01/13 1148 Reviewed diabetes education with patient, son at bedside via teach-back. Patient verbalizes s/s of hypo and hyperglycemia, when to call MD, importance of foot care. Education sheets and diabetes booklet provided. Demonstrated correct use of insulin pen administration. Patient demonstrated correct use of insulin pen, administration, verbally lists and points to appropriate injection sites. Verbalizes understanding of glucose monitoring. Son verbalizes understanding, will assist patient as needed at home. Instructions, medication list, prescriptions, f/u appointment information provided. Home health RN set up with [facility] per case management. Notified [facility] of patient discharge today. IV site d/c'd and within normal limits. No c/o pain at discharge. Pt left floor in stable condition via w/c accompanied by nurse tech. Earnstine RegalAshley Magalie Almon, RN

## 2013-08-02 NOTE — Progress Notes (Signed)
NAME:  Karen Rios Rios, Karen Rios                   ACCOUNT NO.:  MEDICAL RECORD NO.:  [id number]  LOCATION:                                 FACILITY:  PHYSICIAN:  Kingsley Callanderoy O. Ouida SillsFagan, MD       DATE OF BIRTH:  06-28-1956  DATE OF PROCEDURE:  07/31/2013 DATE OF DISCHARGE:                                PROGRESS NOTE   HISTORY OF PRESENT ILLNESS:  Karen Rios Rios has had gradual improvement of her glucose control.  She experienced an episode during the night, however, apparent changes with her speech and recurrent symptoms in her left hand and arm.  Symptoms resolved spontaneously.  She underwent a CT scan of the brain which revealed no acute stroke.  She has no similar symptoms this morning.  She is speaking well and has normal strength.  PHYSICAL EXAMINATION:  VITAL SIGNS:  Temperature 98, pulse 108, respirations 18, blood pressure 136/82. LUNGS:  Clear. HEART:  Regular with no murmurs. ABDOMEN:  Soft and nontender. NEURO:  No focal deficit.  IMPRESSION/PLAN: 1. Diabetic ketoacidosis.  Acidosis has resolved, bicarb is 25.     Hyperglycemia has gradually improved.  Her glucose this morning is     237.  She is receiving Levemir and NovoLog.  Her hemoglobin A1c is     18.  Continue Endocrine recommendations. 2. Hypertension.  She is normotensive this morning. 3. Episodic flexor spasms.  She has been evaluated by Neurology. 4. Urinary tract infection.  Urinalysis reveals 21-50 white cells.     Culture reveals E. coli.  We will start oral therapies today.     Kingsley Callanderoy O. Ouida SillsFagan, MD     ROF/MEDQ  D:  07/31/2013  T:  07/31/2013  Job:  [id number]

## 2014-07-04 ENCOUNTER — Encounter: Payer: Medicare Other | Attending: "Endocrinology | Admitting: Nutrition

## 2014-07-04 VITALS — Ht 69.0 in | Wt 294.8 lb

## 2014-07-04 DIAGNOSIS — E1165 Type 2 diabetes mellitus with hyperglycemia: Secondary | ICD-10-CM

## 2014-07-04 DIAGNOSIS — Z794 Long term (current) use of insulin: Secondary | ICD-10-CM | POA: Insufficient documentation

## 2014-07-04 DIAGNOSIS — IMO0002 Reserved for concepts with insufficient information to code with codable children: Secondary | ICD-10-CM

## 2014-07-04 DIAGNOSIS — Z713 Dietary counseling and surveillance: Secondary | ICD-10-CM | POA: Diagnosis not present

## 2014-07-04 DIAGNOSIS — E118 Type 2 diabetes mellitus with unspecified complications: Secondary | ICD-10-CM | POA: Diagnosis present

## 2014-07-04 NOTE — Patient Instructions (Signed)
Plan:  Aim for 3 Carb Choices per meal (45 grams) +/- 1 either way  Avoid snacks daily. Drink 5-6 bottles per day. [facility]  For 45 mins to 1 hr  4-5 times per week. Eat a piece of fruit with each meal. Eat 1 boiled egg with 1 pkt of oatmeal and 1 piece of fruit for breakfast. Goal:Lose 1 lb per week.  2. Keep A1C 5.5% or below.

## 2014-07-04 NOTE — Progress Notes (Signed)
  Medical Nutrition Therapy:  Appt start time: 1100 end time:  1145  Assessment:  Primary concerns today: Diabetes. Lost her husband a year ago. Has worked on changing her eating habits.  Her A1C was 18.8%  And now down to 5.5%. [facility] [employer] [facility] doing water aerobics.. Has lost 40 lbs in the last year. Use to weight 340 lbs and now weighs 294.8 lbs . Takes Metformin 1000 mg twice a day. She lives by herself and does her own shopping and cooking. Mostly eats at home. Might get a 6 inch sub from [employer] for lunch sometimes. Only bakes and broils and buys fresh or frozen vegetables. Isn't testing since A1C was 5.5% per Dr. Fransico HimNida.  Preferred Learning Style:     No preference indicated   Learning Readiness:   Ready  Change in progress  MEDICATIONS: see list   DIETARY INTAKE:  24-hr recall:  B ( AM): 1 pkt of cinnamon and raisin oatmeal, water, coffee  Snk ( AM): none L ( PM): 6 " sub from subway,water  Snk ( PM):  D ( PM): Chicken, broccoli, carrots/corn Snk ( PM): Beverages: water  Usual physical activity:  [facility]ng [facility] [employer] for [facility]ics 4 times per week.  Estimated energy needs: 1800 calories 200 g carbohydrates 135 g protein 50 g fat  Progress Towards Goal(s):  In progress.   Nutritional Diagnosis:  NB-1.1 Food and nutrition-related knowledge deficit As related to Diabetes.  As evidenced by A1C that was 18% and now down to 5.5% .    Intervention:  Nutrition counseling and diabetes education on meal planning with My Plate, carb counting, portion sizes, weight loss tips, benefits of exercise and prevention of complications from DM.  Plan:  Aim for 3 Carb Choices per meal (45 grams) +/- 1 either way  Avoid snacks daily. Drink 5-6 bottles per day. [facility]  For 45 mins to 1 hr  4-5 times per week. Eat a piece of fruit with each meal. Eat 1 boiled egg with 1 pkt of oatmeal and 1 piece of fruit for breakfast. Goal:Lose 1 lb per week.  2. Keep  A1C 5.5% or below   Teaching Method Utilized:Visual Auditory Hands on  Handouts given during visit include: The Plate Method Meal Plan Card  Barriers to learning/adherence to lifestyle change: none  Demonstrated degree of understanding via:  Teach Back   Monitoring/Evaluation:  Dietary intake, exercise, meal planning, and body weight in 1 month(s).

## 2014-07-26 DIAGNOSIS — I10 Essential (primary) hypertension: Secondary | ICD-10-CM | POA: Diagnosis not present

## 2014-07-26 DIAGNOSIS — M545 Low back pain: Secondary | ICD-10-CM | POA: Diagnosis not present

## 2014-07-26 DIAGNOSIS — E119 Type 2 diabetes mellitus without complications: Secondary | ICD-10-CM | POA: Diagnosis not present

## 2014-07-26 DIAGNOSIS — E669 Obesity, unspecified: Secondary | ICD-10-CM | POA: Diagnosis not present

## 2014-08-04 ENCOUNTER — Encounter: Payer: Medicare Other | Admitting: Nutrition

## 2014-08-15 ENCOUNTER — Encounter: Payer: Medicare Other | Attending: "Endocrinology | Admitting: Nutrition

## 2014-08-15 ENCOUNTER — Encounter: Payer: Self-pay | Admitting: Nutrition

## 2014-08-15 VITALS — Ht 69.0 in | Wt 292.0 lb

## 2014-08-15 DIAGNOSIS — Z713 Dietary counseling and surveillance: Secondary | ICD-10-CM | POA: Insufficient documentation

## 2014-08-15 DIAGNOSIS — IMO0002 Reserved for concepts with insufficient information to code with codable children: Secondary | ICD-10-CM

## 2014-08-15 DIAGNOSIS — Z794 Long term (current) use of insulin: Secondary | ICD-10-CM | POA: Diagnosis not present

## 2014-08-15 DIAGNOSIS — E118 Type 2 diabetes mellitus with unspecified complications: Secondary | ICD-10-CM | POA: Insufficient documentation

## 2014-08-15 DIAGNOSIS — E1165 Type 2 diabetes mellitus with hyperglycemia: Secondary | ICD-10-CM

## 2014-08-15 NOTE — Patient Instructions (Signed)
Plan:  Keep up the GREAT JOB!! Eat three balanced meals. [facility] 4-5 times per week. Lose 1 lb per week til next viist Keep A1C less than 5.,7%

## 2014-08-15 NOTE — Progress Notes (Signed)
  Medical Nutrition Therapy:  Appt start time: 1100 end time:  1145  Assessment:  Primary concerns today: Diabetes. Changed: eating habits by eating smaller portions and eating healthier foods and cut out snacks and drinking a lot more water. Has more energy and feels better. Clothes fit a lot loser. Lost 2 lbs from last visit but thinks she has lost more since she didn't take her fluid pill yet today. [facility] per [facility]. Measureing foods out and following the Plate Method as instructed. FBS 102.    Preferred Learning Style:     No preference indicated   Learning Readiness:   Ready  Change in progress  MEDICATIONS: see list   DIETARY INTAKE:  24-hr recall:  B ( AM): 1 pkt of oatmeal, 1 egg and 1/2 banana and 1 slice of ww bread Snk ( AM): none L ( PM): Pintos beans, greens,  Fruit cocktail, water, skim milk Snk ( PM):  D ( PM): Beets, broccoli, string beans, chicken , water Snk ( PM): Beverages: water  Usual physical activity:  [facility] [employer] [facility] 4 times per week.  Estimated energy needs: 1800 calories 200 g carbohydrates 135 g protein 50 g fat  Progress Towards Goal(s):  In progress.   Nutritional Diagnosis:  NB-1.1 Food and nutrition-related knowledge deficit As related to Diabetes.  As evidenced by A1C that was 18% and now down to 5.5% .    Intervention:  Nutrition counseling and diabetes education on meal planning with My Plate, carb counting, portion sizes, weight loss tips, benefits of exercise and prevention of complications from DM.   Plan:  Keep up the GREAT JOB!! Eat three balanced meals. [facility]-5 times per week. Lose 1 lb per week til next viist Keep A1C less than 5.7%  Teaching Method Utilized:Visual Auditory Hands on  Handouts given during visit include: The Plate Method Meal Plan Card  Barriers to learning/adherence to lifestyle change: none  Demonstrated degree of understanding via:   Teach Back   Monitoring/Evaluation:  Dietary intake, exercise, meal planning, and body weight in 1 month(s).

## 2014-08-30 DIAGNOSIS — E782 Mixed hyperlipidemia: Secondary | ICD-10-CM | POA: Diagnosis not present

## 2014-08-30 DIAGNOSIS — Z713 Dietary counseling and surveillance: Secondary | ICD-10-CM | POA: Diagnosis not present

## 2014-08-30 DIAGNOSIS — I10 Essential (primary) hypertension: Secondary | ICD-10-CM | POA: Diagnosis not present

## 2014-08-30 DIAGNOSIS — E1165 Type 2 diabetes mellitus with hyperglycemia: Secondary | ICD-10-CM | POA: Diagnosis not present

## 2014-08-30 LAB — HEMOGLOBIN A1C: HEMOGLOBIN A1C: 5.8 % (ref 4.0–6.0)

## 2014-10-14 ENCOUNTER — Encounter: Payer: Medicare Other | Attending: Orthopedic Surgery | Admitting: Nutrition

## 2014-10-14 VITALS — Ht 69.0 in | Wt 288.0 lb

## 2014-10-14 DIAGNOSIS — E118 Type 2 diabetes mellitus with unspecified complications: Secondary | ICD-10-CM | POA: Diagnosis not present

## 2014-10-14 DIAGNOSIS — Z6841 Body Mass Index (BMI) 40.0 and over, adult: Secondary | ICD-10-CM | POA: Insufficient documentation

## 2014-10-14 DIAGNOSIS — Z794 Long term (current) use of insulin: Secondary | ICD-10-CM | POA: Diagnosis not present

## 2014-10-14 DIAGNOSIS — Z713 Dietary counseling and surveillance: Secondary | ICD-10-CM | POA: Diagnosis not present

## 2014-10-14 NOTE — Progress Notes (Signed)
  Medical Nutrition Therapy:  Appt start time: 1130 end time:  1145  Assessment:  Primary concerns today: Diabetes. Changed: eating habits by eating smaller portions and eating healthier foods and cut out snacks and drinking a lot more water.  FBS: lower 100's. Still has more energy and feels better. Clothes fit a lot loser. Lost 4 lbs from last visit but thinks she has lost more since she didn't take her fluid pill yet today. [facility] per [facility]. Measureing foods out and following the Plate Method as instructed.     Preferred Learning Style:     No preference indicated   Learning Readiness:   Ready  Change in progress  MEDICATIONS: see list   DIETARY INTAKE:  24-hr recall:  B ( AM): 1 pkt of oatmeal, 1 egg and 1/2 banana and 1 slice of ww bread Snk ( AM): none L ( PM): Greens, corn/peas, chicken baked, water Snk ( PM):  D ( PM): Mixed vegetables, baked chicken, brussell sprouts, water and 1 cup milk Snk ( PM): Beverages: water  Usual physical activity:  [facility] [employer] 4 times per week and walking in the pool  Estimated energy needs: 1800 calories 200 g carbohydrates 135 g protein 50 g fat  Progress Towards Goal(s):  In progress.   Nutritional Diagnosis:  NB-1.1 Food and nutrition-related knowledge deficit As related to Diabetes.  As evidenced by A1C that was 18% and now down to 5.5% .    Intervention:  Nutrition counseling and diabetes education on meal planning with My Plate, carb counting, portion sizes, weight loss tips, benefits of exercise and prevention of complications from DM.   Plan:  Keep up the GREAT JOB!! Keep eating your three balanced meals. [facility]. Lose 1 lb per week til next viist Keep A1C less than 5.7%  Teaching Method Utilized:Visual Auditory Hands on  Handouts given during visit include: The Plate Method Meal Plan Card  Barriers to learning/adherence to  lifestyle change: none  Demonstrated degree of understanding via:  Teach Back   Monitoring/Evaluation:  Dietary intake, exercise, meal planning, and body weight in 1 month(s).

## 2014-10-17 ENCOUNTER — Encounter: Payer: Self-pay | Admitting: Nutrition

## 2014-10-17 NOTE — Patient Instructions (Signed)
  Plan:  Keep up the GREAT JOB!! Keep eating your three balanced meals. [facility]. Lose 1 lb per week til next viist Keep A1C less than 5.7%

## 2014-11-24 DIAGNOSIS — Z0000 Encounter for general adult medical examination without abnormal findings: Secondary | ICD-10-CM | POA: Diagnosis not present

## 2014-11-28 ENCOUNTER — Other Ambulatory Visit (HOSPITAL_COMMUNITY): Payer: Self-pay | Admitting: Pulmonary Disease

## 2014-11-28 DIAGNOSIS — E1165 Type 2 diabetes mellitus with hyperglycemia: Secondary | ICD-10-CM | POA: Diagnosis not present

## 2014-11-28 DIAGNOSIS — E785 Hyperlipidemia, unspecified: Secondary | ICD-10-CM | POA: Diagnosis not present

## 2014-11-28 DIAGNOSIS — E782 Mixed hyperlipidemia: Secondary | ICD-10-CM | POA: Diagnosis not present

## 2014-11-28 DIAGNOSIS — E119 Type 2 diabetes mellitus without complications: Secondary | ICD-10-CM | POA: Diagnosis not present

## 2014-11-28 DIAGNOSIS — E669 Obesity, unspecified: Secondary | ICD-10-CM | POA: Diagnosis not present

## 2014-11-28 DIAGNOSIS — I10 Essential (primary) hypertension: Secondary | ICD-10-CM | POA: Diagnosis not present

## 2014-11-28 DIAGNOSIS — Z713 Dietary counseling and surveillance: Secondary | ICD-10-CM | POA: Diagnosis not present

## 2014-11-28 DIAGNOSIS — F419 Anxiety disorder, unspecified: Secondary | ICD-10-CM | POA: Diagnosis not present

## 2014-11-28 DIAGNOSIS — Z1231 Encounter for screening mammogram for malignant neoplasm of breast: Secondary | ICD-10-CM

## 2014-11-29 DIAGNOSIS — Z1211 Encounter for screening for malignant neoplasm of colon: Secondary | ICD-10-CM | POA: Diagnosis not present

## 2014-11-29 LAB — HEMOGLOBIN A1C: HEMOGLOBIN A1C: 5.7 % (ref 4.0–6.0)

## 2014-12-05 DIAGNOSIS — I10 Essential (primary) hypertension: Secondary | ICD-10-CM | POA: Diagnosis not present

## 2014-12-05 DIAGNOSIS — E1165 Type 2 diabetes mellitus with hyperglycemia: Secondary | ICD-10-CM | POA: Diagnosis not present

## 2014-12-05 DIAGNOSIS — E6609 Other obesity due to excess calories: Secondary | ICD-10-CM | POA: Diagnosis not present

## 2014-12-05 DIAGNOSIS — E785 Hyperlipidemia, unspecified: Secondary | ICD-10-CM | POA: Diagnosis not present

## 2014-12-19 ENCOUNTER — Other Ambulatory Visit (HOSPITAL_COMMUNITY): Payer: Self-pay | Admitting: Pulmonary Disease

## 2014-12-19 DIAGNOSIS — R229 Localized swelling, mass and lump, unspecified: Principal | ICD-10-CM

## 2014-12-19 DIAGNOSIS — IMO0002 Reserved for concepts with insufficient information to code with codable children: Secondary | ICD-10-CM

## 2014-12-28 ENCOUNTER — Ambulatory Visit (HOSPITAL_COMMUNITY): Payer: Self-pay

## 2015-01-03 ENCOUNTER — Ambulatory Visit (HOSPITAL_COMMUNITY)
Admission: RE | Admit: 2015-01-03 | Discharge: 2015-01-03 | Disposition: A | Discharge status: Home / Self Care | Payer: Medicare Other | Source: Ambulatory Visit | Attending: Pulmonary Disease | Admitting: Pulmonary Disease

## 2015-01-03 ENCOUNTER — Other Ambulatory Visit (HOSPITAL_COMMUNITY): Payer: Self-pay | Admitting: Pulmonary Disease

## 2015-01-03 DIAGNOSIS — R229 Localized swelling, mass and lump, unspecified: Principal | ICD-10-CM

## 2015-01-03 DIAGNOSIS — N63 Unspecified lump in breast: Secondary | ICD-10-CM | POA: Diagnosis not present

## 2015-01-03 DIAGNOSIS — R928 Other abnormal and inconclusive findings on diagnostic imaging of breast: Secondary | ICD-10-CM | POA: Diagnosis not present

## 2015-01-03 DIAGNOSIS — IMO0002 Reserved for concepts with insufficient information to code with codable children: Secondary | ICD-10-CM

## 2015-02-15 ENCOUNTER — Encounter: Payer: Medicare Other | Attending: "Endocrinology | Admitting: Nutrition

## 2015-02-15 ENCOUNTER — Encounter: Payer: Self-pay | Admitting: Nutrition

## 2015-02-15 DIAGNOSIS — Z713 Dietary counseling and surveillance: Secondary | ICD-10-CM | POA: Diagnosis not present

## 2015-02-15 DIAGNOSIS — E118 Type 2 diabetes mellitus with unspecified complications: Secondary | ICD-10-CM | POA: Insufficient documentation

## 2015-02-15 DIAGNOSIS — Z794 Long term (current) use of insulin: Secondary | ICD-10-CM | POA: Diagnosis not present

## 2015-02-15 DIAGNOSIS — Z6841 Body Mass Index (BMI) 40.0 and over, adult: Secondary | ICD-10-CM | POA: Insufficient documentation

## 2015-02-15 DIAGNOSIS — E669 Obesity, unspecified: Secondary | ICD-10-CM | POA: Insufficient documentation

## 2015-02-15 NOTE — Progress Notes (Signed)
  Medical Nutrition Therapy:  Appt start time: 1130 end time:  1145  Assessment:  Primary concerns today: Diabetes. Lost 4 lbs. Still working on eating more fresh fruits and vegetables. Goes to the [employer] daily for water aerobics. A1C was 5.7% down orginally from 18.8% 2 years ago. Not testing due to good blood sugars. Diet is fairly well balanced. No low blood sugars.  Wants to continue to work on weight loss.    Preferred Learning Style:     No preference indicated   Learning Readiness:   Ready  Change in progress  MEDICATIONS: see list   DIETARY INTAKE:  24-hr recall:  B ( AM): 1 pkt of oatmeal, 1 egg and 1/2 banana and 1 slice of ww bread Snk ( AM): none L ( PM): Peas, corn, roast beef and string beans. waterSnk ( PM):  D ( PM):CHicken baked, mixed vegetables, cottage cheese,  Snk ( PM): apple and milk  Beverages: water  Usual physical activity:  Goinng to [facility] for [facility] aerobics 4 times per week and walking in the pool  Estimated energy needs: 1800 calories 200 g carbohydrates 135 g protein 50 g fat  Progress Towards Goal(s):  In progress.   Nutritional Diagnosis:  NB-1.1 Food and nutrition-related knowledge deficit As related to Diabetes.  As evidenced by A1C that was 18% and now down to 5.5% .    Intervention:  Nutrition counseling and diabetes education on meal planning with My Plate, carb counting, portion sizes, weight loss tips, benefits of exercise and prevention of complications from DM.   Plan:  Keep up the GREAT JOB!! Keep eating your three balanced meals. Keep going to the [facility]-5 times per week. Lose 1 lb per week til next viist Keep A1C less than 5.7%  Teaching Method Utilized:Visual Auditory Hands on  Handouts given during visit include: The Plate Method Meal Plan Card  Barriers to learning/adherence to lifestyle change: none  Demonstrated degree of understanding via:  Teach Back   Monitoring/Evaluation:  Dietary intake, exercise, meal  planning, and body weight in 3 month(s) to continue to work on weight loss. Marland Kitchen

## 2015-02-21 NOTE — Patient Instructions (Signed)
Plan:  Keep up the GREAT JOB!! Keep eating your three balanced meals. [facility] times per [facility]. Lose 1 lb per week til next viist Keep A1C less than 5.7% 

## 2015-03-01 DIAGNOSIS — E785 Hyperlipidemia, unspecified: Secondary | ICD-10-CM | POA: Diagnosis not present

## 2015-03-01 DIAGNOSIS — Z713 Dietary counseling and surveillance: Secondary | ICD-10-CM | POA: Diagnosis not present

## 2015-03-01 DIAGNOSIS — E1165 Type 2 diabetes mellitus with hyperglycemia: Secondary | ICD-10-CM | POA: Diagnosis not present

## 2015-03-01 DIAGNOSIS — I10 Essential (primary) hypertension: Secondary | ICD-10-CM | POA: Diagnosis not present

## 2015-03-02 LAB — HEMOGLOBIN A1C: Hgb A1c MFr Bld: 5.7 % (ref 4.0–6.0)

## 2015-03-04 ENCOUNTER — Encounter: Payer: Self-pay | Admitting: "Endocrinology

## 2015-03-04 DIAGNOSIS — IMO0002 Reserved for concepts with insufficient information to code with codable children: Secondary | ICD-10-CM

## 2015-03-04 DIAGNOSIS — E119 Type 2 diabetes mellitus without complications: Secondary | ICD-10-CM | POA: Insufficient documentation

## 2015-03-04 DIAGNOSIS — E1165 Type 2 diabetes mellitus with hyperglycemia: Secondary | ICD-10-CM

## 2015-03-08 DIAGNOSIS — E1165 Type 2 diabetes mellitus with hyperglycemia: Secondary | ICD-10-CM | POA: Diagnosis not present

## 2015-03-08 DIAGNOSIS — E785 Hyperlipidemia, unspecified: Secondary | ICD-10-CM | POA: Diagnosis not present

## 2015-03-08 DIAGNOSIS — E6609 Other obesity due to excess calories: Secondary | ICD-10-CM | POA: Diagnosis not present

## 2015-03-08 DIAGNOSIS — I10 Essential (primary) hypertension: Secondary | ICD-10-CM | POA: Diagnosis not present

## 2015-03-27 DIAGNOSIS — M545 Low back pain: Secondary | ICD-10-CM | POA: Diagnosis not present

## 2015-03-27 DIAGNOSIS — I10 Essential (primary) hypertension: Secondary | ICD-10-CM | POA: Diagnosis not present

## 2015-03-27 DIAGNOSIS — M1990 Unspecified osteoarthritis, unspecified site: Secondary | ICD-10-CM | POA: Diagnosis not present

## 2015-03-27 DIAGNOSIS — E119 Type 2 diabetes mellitus without complications: Secondary | ICD-10-CM | POA: Diagnosis not present

## 2015-04-12 ENCOUNTER — Other Ambulatory Visit: Payer: Self-pay | Admitting: "Endocrinology

## 2015-05-11 ENCOUNTER — Other Ambulatory Visit: Payer: Self-pay | Admitting: "Endocrinology

## 2015-05-18 ENCOUNTER — Ambulatory Visit: Payer: Self-pay | Admitting: Nutrition

## 2015-06-02 ENCOUNTER — Encounter: Payer: Self-pay | Admitting: Nutrition

## 2015-06-02 ENCOUNTER — Encounter: Payer: Medicare Other | Attending: "Endocrinology | Admitting: Nutrition

## 2015-06-02 VITALS — Ht 69.0 in | Wt 278.5 lb

## 2015-06-02 DIAGNOSIS — E119 Type 2 diabetes mellitus without complications: Secondary | ICD-10-CM | POA: Insufficient documentation

## 2015-06-02 DIAGNOSIS — Z6841 Body Mass Index (BMI) 40.0 and over, adult: Secondary | ICD-10-CM | POA: Diagnosis not present

## 2015-06-02 DIAGNOSIS — Z794 Long term (current) use of insulin: Secondary | ICD-10-CM | POA: Diagnosis not present

## 2015-06-02 DIAGNOSIS — Z713 Dietary counseling and surveillance: Secondary | ICD-10-CM | POA: Insufficient documentation

## 2015-06-02 NOTE — Progress Notes (Signed)
  Medical Nutrition Therapy:  Appt start time: 1130 end time:  1145  Assessment:  Primary concerns today: Diabetes.  Lost 10 lbs . Still working on eating more fresh fruits and vegetables.  Eating on smaller plate and eating better balanced meals. Drinking water. [facility] [employer] [facility]. A1C was 5.7% down orginally from 18.8% 2 years ago. Had been depresseed in the past but now is feeling great and is happy to be eating healthy and being physically active. Not testing due to good blood sugars. Diet is fairly well balanced. No low blood sugars.  Wants to continue to work on weight loss.     Preferred Learning Style:     No preference indicated   Learning Readiness:   Ready  Change in progress  MEDICATIONS: see list   DIETARY INTAKE:  24-hr recall:  B ( AM): Oatmeal and boiled, egg, 1 slice of ww bread and fruit Snk ( AM): none L ( PM): Chicken, corn, string beans, broccoli, water D ( PM):Fish, corn, cauliflower, 1 milk, apple Snk ( PM):    Beverages: water  Usual physical activity:  [facility] 4 times per week and walking in the pool  Estimated energy needs: 1800 calories 200 g carbohydrates 135 g protein 50 g fat  Progress Towards Goal(s):  In progress.   Nutritional Diagnosis:  NB-1.1 Food and nutrition-related knowledge deficit As related to Diabetes.  As evidenced by A1C that was 18% and now down to 5.5% .    Intervention:  Nutrition counseling and diabetes education on meal planning with My Plate, carb counting, portion sizes, weight loss tips, benefits of exercise and prevention of complications from DM.   Plan:  Keep up the GREAT JOB!! Keep eating your three balanced meals. [facility]. Lose 1 lb per week til next viist Keep A1C less than 5.7%  Teaching Method Utilized:Visual Auditory Hands on  Handouts given during visit include: The Plate Method Meal Plan Card  Barriers to  learning/adherence to lifestyle change: none  Demonstrated degree of understanding via:  Teach Back   Monitoring/Evaluation:  Dietary intake, exercise, meal planning, and body weight in 3 month(s) to continue to work on weight loss. .Marland Kitchen

## 2015-06-02 NOTE — Patient Instructions (Signed)
Plan:  Keep up the GREAT JOB!! Keep eating your three balanced meals. [facility]. Lose 1 lb per week til next viist Keep A1C less than 5.7%

## 2015-06-27 ENCOUNTER — Other Ambulatory Visit: Payer: Self-pay | Admitting: "Endocrinology

## 2015-07-05 DIAGNOSIS — E785 Hyperlipidemia, unspecified: Secondary | ICD-10-CM | POA: Diagnosis not present

## 2015-07-05 DIAGNOSIS — E559 Vitamin D deficiency, unspecified: Secondary | ICD-10-CM | POA: Diagnosis not present

## 2015-07-05 DIAGNOSIS — I10 Essential (primary) hypertension: Secondary | ICD-10-CM | POA: Diagnosis not present

## 2015-07-05 DIAGNOSIS — E1165 Type 2 diabetes mellitus with hyperglycemia: Secondary | ICD-10-CM | POA: Diagnosis not present

## 2015-07-05 LAB — HEMOGLOBIN A1C: Hemoglobin A1C: 5.6

## 2015-07-12 ENCOUNTER — Encounter: Payer: Self-pay | Admitting: "Endocrinology

## 2015-07-12 ENCOUNTER — Ambulatory Visit (INDEPENDENT_AMBULATORY_CARE_PROVIDER_SITE_OTHER): Payer: Medicare Other | Admitting: "Endocrinology

## 2015-07-12 VITALS — BP 169/92 | HR 92 | Ht 67.0 in | Wt 280.0 lb

## 2015-07-12 DIAGNOSIS — E669 Obesity, unspecified: Secondary | ICD-10-CM | POA: Diagnosis not present

## 2015-07-12 DIAGNOSIS — E782 Mixed hyperlipidemia: Secondary | ICD-10-CM | POA: Insufficient documentation

## 2015-07-12 DIAGNOSIS — IMO0001 Reserved for inherently not codable concepts without codable children: Secondary | ICD-10-CM

## 2015-07-12 DIAGNOSIS — E1165 Type 2 diabetes mellitus with hyperglycemia: Secondary | ICD-10-CM | POA: Diagnosis not present

## 2015-07-12 DIAGNOSIS — E785 Hyperlipidemia, unspecified: Secondary | ICD-10-CM | POA: Diagnosis not present

## 2015-07-12 DIAGNOSIS — I10 Essential (primary) hypertension: Secondary | ICD-10-CM

## 2015-07-12 MED ORDER — METFORMIN HCL 1000 MG PO TABS: 500.0000 mg | ORAL_TABLET | Freq: Two times a day (BID) | ORAL | Status: DC

## 2015-07-12 MED ORDER — HYDROCHLOROTHIAZIDE 25 MG PO TABS: 25.0000 mg | ORAL_TABLET | Freq: Every day | ORAL | Status: DC

## 2015-07-12 NOTE — Progress Notes (Signed)
Subjective:    Patient ID: Karen Rios, female    DOB: 1956/02/20, PCP Fredirick Maudlin, MD   Past Medical History  Diagnosis Date  . Hypertension   . Arthritis   . Diabetes mellitus without complication [facility])    Past Surgical History  Procedure Laterality Date  . Tubal ligation     Social History   Social History  . Marital Status: Married    Spouse Name: N/A  . Number of Children: N/A  . Years of Education: N/A   Social History Main Topics  . Smoking status: Never Smoker   . Smokeless tobacco: None  . Alcohol Use: No  . Drug Use: No  . Sexual Activity: Not Asked   Other Topics Concern  . None   Social History Narrative   Outpatient Encounter Prescriptions as of 07/12/2015  Medication Sig  . benazepril (LOTENSIN) 40 MG tablet TAKE 1 TABLET BY MOUTH EVERY DAY  . furosemide (LASIX) 40 MG tablet Take 40 mg by mouth every morning.  . metFORMIN (GLUCOPHAGE) 1000 MG tablet Take 0.5 tablets (500 mg total) by mouth 2 (two) times daily.  . Multiple Vitamins-Minerals (CENTRUM SILVER ULTRA WOMENS) TABS Take 1 tablet by mouth every morning.  . simvastatin (ZOCOR) 20 MG tablet TAKE 1 TABLET AT BEDTIME  . [DISCONTINUED] hydrochlorothiazide (MICROZIDE) 12.5 MG capsule Take 12.5 mg by mouth 2 (two) times daily.  . [DISCONTINUED] metFORMIN (GLUCOPHAGE) 1000 MG tablet TAKE 1 TABLET BY MOUTH TWICE A DAY  . ACCU-CHEK AVIVA PLUS test strip USE ONE STRIP 4 TIMES A DAY. (Patient not taking: Reported on 06/02/2015)  . hydrochlorothiazide (HYDRODIURIL) 25 MG tablet Take 1 tablet (25 mg total) by mouth daily.  . [DISCONTINUED] amLODipine (NORVASC) 5 MG tablet Take 5 mg by mouth every morning.  . [DISCONTINUED] ciprofloxacin (CIPRO) 250 MG tablet Take 1 tablet (250 mg total) by mouth 2 (two) times daily. (Patient not taking: Reported on 06/02/2015)  . [DISCONTINUED] insulin aspart (NOVOLOG) 100 UNIT/ML injection Inject 18 Units into the skin 3 (three) times daily with meals. (Patient  not taking: Reported on 07/04/2014)  . [DISCONTINUED] insulin detemir (LEVEMIR) 100 UNIT/ML injection Inject 0.6 mLs (60 Units total) into the skin at bedtime. (Patient not taking: Reported on 07/04/2014)  . [DISCONTINUED] sitaGLIPtin (JANUVIA) 25 MG tablet Take 25 mg by mouth daily.   No facility-administered encounter medications on file as of 07/12/2015.   ALLERGIES: No Known Allergies VACCINATION STATUS:  There is no immunization history on file for this patient.  Diabetes She presents for her follow-up diabetic visit. She has type 2 diabetes mellitus. Onset time: She was diagnosed at approximate age of 44 years. Her disease course has been improving (She has improved her A1c from 18.8% to 5.6% !). There are no hypoglycemic associated symptoms. Pertinent negatives for hypoglycemia include no confusion, headaches, pallor or seizures. There are no diabetic associated symptoms. Pertinent negatives for diabetes include no chest pain, no polydipsia, no polyphagia and no polyuria. There are no hypoglycemic complications. Symptoms are improving. There are no diabetic complications. Risk factors for coronary artery disease include diabetes mellitus, dyslipidemia, hypertension, obesity and sedentary lifestyle. Current diabetic treatment includes oral agent (monotherapy). She is compliant with treatment most of the time. Her weight is decreasing steadily. She is following a diabetic diet. When asked about meal planning, she reported none. She has had a previous visit with a dietitian. She participates in exercise intermittently. An ACE inhibitor/angiotensin II receptor blocker is being taken.  Hyperlipidemia  This is a chronic problem. The current episode started more than 1 year ago. The problem is controlled. Exacerbating diseases include diabetes. Pertinent negatives include no chest pain, focal sensory loss, myalgias or shortness of breath. Current antihyperlipidemic treatment includes statins.   Hypertension This is a chronic problem. The current episode started more than 1 year ago. The problem is uncontrolled (She ran out of her hydrochlorothiazide.). Pertinent negatives include no chest pain, headaches, palpitations or shortness of breath. Risk factors for coronary artery disease include diabetes mellitus, obesity and sedentary lifestyle. Past treatments include ACE inhibitors and diuretics. The current treatment provides mild improvement. Compliance problems include medication cost.      Review of Systems  Constitutional: Negative for fever, chills and unexpected weight change.  HENT: Negative for trouble swallowing and voice change.   Eyes: Negative for visual disturbance.  Respiratory: Negative for cough, shortness of breath and wheezing.   Cardiovascular: Negative for chest pain, palpitations and leg swelling.  Gastrointestinal: Negative for nausea, vomiting and diarrhea.  Endocrine: Negative for cold intolerance, heat intolerance, polydipsia, polyphagia and polyuria.  Musculoskeletal: Negative for myalgias and arthralgias.  Skin: Negative for color change, pallor, rash and wound.  Neurological: Negative for seizures and headaches.  Psychiatric/Behavioral: Negative for suicidal ideas and confusion.    Objective:    BP 169/92 mmHg  Pulse 92  Ht 5\' 7"  (1.702 m)  Wt 280 lb (127.007 kg)  BMI 43.84 kg/m2  SpO2 98%  Wt Readings from Last 3 Encounters:  07/12/15 280 lb (127.007 kg)  06/02/15 278 lb 8 oz (126.327 kg)  12/05/14 285 lb (129.275 kg)    Physical Exam  Constitutional: She is oriented to person, place, and time. She appears well-developed.  HENT:  Head: Normocephalic and atraumatic.  Eyes: EOM are normal.  Neck: Normal range of motion. Neck supple. No tracheal deviation present. No thyromegaly present.  Cardiovascular: Normal rate and regular rhythm.   Pulmonary/Chest: Effort normal and breath sounds normal.  Abdominal: Soft. Bowel sounds are normal. There  is no tenderness. There is no guarding.  Musculoskeletal: Normal range of motion. She exhibits no edema.  Neurological: She is alert and oriented to person, place, and time. She has normal reflexes. No cranial nerve deficit. Coordination normal.  Skin: Skin is warm and dry. No rash noted. No erythema. No pallor.  Psychiatric: She has a normal mood and affect. Judgment normal.     CMP ( most recent) CMP     Component Value Date/Time   NA 135* 07/31/2013 0653   K 3.7 07/31/2013 0653   CL 97 07/31/2013 0653   CO2 25 07/31/2013 0653   GLUCOSE 256* 07/31/2013 0653   BUN 11 07/31/2013 0653   CREATININE 0.61 07/31/2013 0653   CALCIUM 8.6 07/31/2013 0653   PROT 7.2 07/28/2013 0409   ALBUMIN 3.2* 07/28/2013 0409   AST 14 07/28/2013 0409   ALT 18 07/28/2013 0409   ALKPHOS 100 07/28/2013 0409   BILITOT 0.3 07/28/2013 0409   GFRNONAA >90 07/31/2013 0653   GFRAA >90 07/31/2013 0653     Diabetic Labs (most recent): Lab Results  Component Value Date   HGBA1C 5.6 07/05/2015   HGBA1C 5.7 03/02/2015   HGBA1C 5.7 11/29/2014       Assessment & Plan:   1. Uncontrolled type 2 diabetes mellitus without complication, without long-term current use of insulin (HCC)  - patient remains at a high risk for more acute and chronic complications of diabetes which include CAD, CVA, CKD, retinopathy,  and neuropathy. These are all discussed in detail with the patient.  Patient came with stable A1c of 5.6 %.   Recent labs reviewed.   - I have re-counseled the patient on diet managemweight loss   by adopting a carbohydrate restricted / protein rich  Diet.  - Suggestion is made for patient to avoid simple carbohydrates   from their diet including Cakes , Desserts, Ice Cream,  Soda (  diet and regular) , Sweet Tea , Candies,  Chips, Cookies, Artificial Sweeteners,   and "Sugar-free" Products .  This will help patient to have stable blood glucose profile and potentially avoid unintended  Weight gain.  -  Patient is advised to stick to a routine mealtimes to eat 3 meals  a day and avoid unnecessary snacks ( to snack only to correct hypoglycemia).  - The patient has beenscheduled with Norm Salt, RDN, CDE for individualized DM education.  - I have approached patient with the following individualized plan to manage diabetes and patient agrees.  -  she will not need insulin treatment for now . - I will  lower the dose of her metformin to 500 mg by mouth twice a day , therapeutically suitable for patient.  - Patient will be considered for incretin therapy as appropriate next visit. - Patient specific target  for A1c; LDL, HDL, Triglycerides, and  Waist Circumference were discussed in detail.  2) BP/HTN: uncontrolledContinue current medications including ACEI/ARB. I would increase her hydrochlorothiazide to 25 mg by mouth daily.  3) Lipids/HPL:  continue statins. 4)  Weight/Diet: CDE consult in progress, exercise, and carbohydrates information provided.  5) Chronic Care/Health Maintenance:  -Patient is on ACEI/ARB and Statin medications and encouraged to continue to follow up with Ophthalmology, Podiatrist at least yearly or according to recommendations, and advised to  stay away from smoking. I have recommended yearly flu vaccine and pneumonia vaccination at least every 5 years; moderate intensity exercise for up to 150 minutes weekly; and  sleep for at least 7 hours a day.  - 25 minutes of time was spent on the care of this patient , 50% of which was applied for counseling on diabetes complications and their preventions.  - I advised patient to maintain close follow up with HAWKINS,EDWARD L, MD for primary care needs.  Patient is asked to bring meter and  blood glucose logs during their next visit.   Follow up plan: -Return in about 6 months (around 01/10/2016) for diabetes, high blood pressure, high cholesterol, follow up with pre-visit labs.  Marquis Lunch, MD Phone: [phone]  Fax:  [phone]   07/13/2015, 6:53 AM

## 2015-07-12 NOTE — Patient Instructions (Signed)

## 2015-07-27 DIAGNOSIS — I10 Essential (primary) hypertension: Secondary | ICD-10-CM | POA: Diagnosis not present

## 2015-07-27 DIAGNOSIS — M545 Low back pain: Secondary | ICD-10-CM | POA: Diagnosis not present

## 2015-07-27 DIAGNOSIS — E119 Type 2 diabetes mellitus without complications: Secondary | ICD-10-CM | POA: Diagnosis not present

## 2015-08-07 ENCOUNTER — Other Ambulatory Visit: Payer: Self-pay | Admitting: "Endocrinology

## 2015-09-06 ENCOUNTER — Encounter: Payer: Self-pay | Admitting: Nutrition

## 2015-09-06 ENCOUNTER — Encounter: Payer: Medicare Other | Attending: "Endocrinology | Admitting: Nutrition

## 2015-09-06 VITALS — Ht 69.0 in | Wt 278.0 lb

## 2015-09-06 DIAGNOSIS — E119 Type 2 diabetes mellitus without complications: Secondary | ICD-10-CM | POA: Insufficient documentation

## 2015-09-06 NOTE — Progress Notes (Signed)
  Medical Nutrition Therapy:  Appt start time: 1130 end time:  1145  Assessment:  Assessment:  Primary concerns today: Diabetes.  Lost 2 lbs  [facility] most days of the week. Metformin was reduced to 500 mg BID now instead of 1000 mg BID. Wondering why she isn't losing weigh fast.  A1C 5.6%. Doing very well with diet. Needs more cardio and resistant weight training that she can get at the [facility] to help with further weight loss.   Lab Results  Component Value Date   HGBA1C 5.6 07/05/2015    Preferred Learning Style:     No preference indicated   Learning Readiness:   Ready  Change in progress  MEDICATIONS: see list   DIETARY INTAKE:  24-hr recall:  B ( AM): Oatmeal and boiled egg, 1 slice of ww bread and fruit Snk ( AM): none L ( PM):  Tuna fish, pinto bean and turnip greens, water D ( PM): Fish,  Cottage cheese and string beans  Snk ( PM):   Beverages: water  Usual physical activity:  Goinng to [employer] for water aerobics 4 times per week and walking in the pool  Estimated energy needs: 1800 calories 200 g carbohydrates 135 g protein 50 g fat  Progress Towards Goal(s):  In progress.   Nutritional Diagnosis:  NB-1.1 Food and nutrition-related knowledge deficit As related to Diabetes.  As evidenced by A1C that was 18% and now down to 5.5% .    Intervention:  Nutrition counseling and diabetes education on meal planning with My Plate, carb counting, portion sizes, weight loss tips, benefits of exercise and prevention of complications from DM.   Plan:  Keep up the GREAT JOB!! Talk to [facility] staff about setting up a routine using weights. Keep going to the [facility] 4-5 times per week. Lose 1 lb per week til next viist Keep A1C less than 5.7%  Teaching Method Utilized:Visual Auditory Hands on  Handouts given during visit include: The Plate Method Meal Plan Card  Barriers to learning/adherence to lifestyle change:  none  Demonstrated degree of understanding via:  Teach Back   Monitoring/Evaluation:  Dietary intake, exercise, meal planning, and body weight in 3 month(s) to continue to work on weight loss. Marland Kitchen

## 2015-09-06 NOTE — Patient Instructions (Signed)
  Plan:  Keep up the GREAT JOB!! Talk to [facility] staff about setting up a routine using weights. Keep going to the [facility] 4-5 times per week. Lose 1 lb per week til next viist Keep A1C less than 5.7%

## 2015-10-30 ENCOUNTER — Other Ambulatory Visit: Payer: Self-pay | Admitting: "Endocrinology

## 2015-11-02 ENCOUNTER — Other Ambulatory Visit: Payer: Self-pay

## 2015-11-02 MED ORDER — HYDROCHLOROTHIAZIDE 25 MG PO TABS: 25.0000 mg | ORAL_TABLET | Freq: Every day | ORAL | Status: DC

## 2015-11-24 DIAGNOSIS — I10 Essential (primary) hypertension: Secondary | ICD-10-CM | POA: Diagnosis not present

## 2015-11-24 DIAGNOSIS — M545 Low back pain: Secondary | ICD-10-CM | POA: Diagnosis not present

## 2015-11-24 DIAGNOSIS — E119 Type 2 diabetes mellitus without complications: Secondary | ICD-10-CM | POA: Diagnosis not present

## 2016-01-02 ENCOUNTER — Other Ambulatory Visit: Payer: Self-pay | Admitting: "Endocrinology

## 2016-01-02 DIAGNOSIS — E1165 Type 2 diabetes mellitus with hyperglycemia: Secondary | ICD-10-CM | POA: Diagnosis not present

## 2016-01-02 LAB — HEMOGLOBIN A1C
Hgb A1c MFr Bld: 5.7 % — ABNORMAL HIGH (ref <5.7)
Mean Plasma Glucose: 117 mg/dL

## 2016-01-02 LAB — BASIC METABOLIC PANEL
BUN: 11 mg/dL (ref 7–25)
CO2: 26 mmol/L (ref 20–31)
CREATININE: 0.67 mg/dL (ref 0.50–0.99)
Calcium: 9 mg/dL (ref 8.6–10.4)
Chloride: 105 mmol/L (ref 98–110)
Glucose, Bld: 92 mg/dL (ref 65–99)
Potassium: 4.2 mmol/L (ref 3.5–5.3)
Sodium: 140 mmol/L (ref 135–146)

## 2016-01-10 ENCOUNTER — Telehealth: Payer: Self-pay | Admitting: Nutrition

## 2016-01-10 NOTE — Telephone Encounter (Signed)
No answer on either phone to leave VM  Reminder of appt.

## 2016-01-11 ENCOUNTER — Ambulatory Visit (INDEPENDENT_AMBULATORY_CARE_PROVIDER_SITE_OTHER): Payer: Medicare Other | Admitting: "Endocrinology

## 2016-01-11 ENCOUNTER — Encounter: Payer: Medicare Other | Attending: "Endocrinology | Admitting: Nutrition

## 2016-01-11 ENCOUNTER — Encounter: Payer: Self-pay | Admitting: "Endocrinology

## 2016-01-11 VITALS — Ht 69.0 in | Wt 290.0 lb

## 2016-01-11 VITALS — BP 178/95 | HR 76 | Ht 69.0 in | Wt 290.0 lb

## 2016-01-11 DIAGNOSIS — E785 Hyperlipidemia, unspecified: Secondary | ICD-10-CM

## 2016-01-11 DIAGNOSIS — E119 Type 2 diabetes mellitus without complications: Secondary | ICD-10-CM

## 2016-01-11 DIAGNOSIS — I10 Essential (primary) hypertension: Secondary | ICD-10-CM

## 2016-01-11 NOTE — Patient Instructions (Signed)
Plan:  Keep up the GREAT JOB!! Increase fresh fruits and vegetables. Increase protein with meals. [facility] staff about setting up a routine using weights. [facility]. Lose 1 lb per week til next viist Keep A1C less than 5.7%

## 2016-01-11 NOTE — Patient Instructions (Signed)

## 2016-01-11 NOTE — Progress Notes (Signed)
Subjective:    Patient ID: Karen Rios, female    DOB: [birth year], PCP Fredirick MaudlinHAWKINS,EDWARD L, MD   Past Medical History  Diagnosis Date  . Hypertension   . Arthritis   . Diabetes mellitus without complication [facility](HCC)    Past Surgical History  Procedure Laterality Date  . Tubal ligation     Social History   Social History  . Marital Status: Married    Spouse Name: N/A  . Number of Children: N/A  . Years of Education: N/A   Social History Main Topics  . Smoking status: Never Smoker   . Smokeless tobacco: None  . Alcohol Use: No  . Drug Use: No  . Sexual Activity: Not Asked   Other Topics Concern  . None   Social History Narrative   Outpatient Encounter Prescriptions as of 01/11/2016  Medication Sig  . ACCU-CHEK AVIVA PLUS test strip USE ONE STRIP 4 TIMES A DAY. (Patient not taking: Reported on 06/02/2015)  . benazepril (LOTENSIN) 40 MG tablet TAKE 1 TABLET BY MOUTH EVERY DAY  . furosemide (LASIX) 40 MG tablet Take 40 mg by mouth every morning.  . hydrochlorothiazide (HYDRODIURIL) 25 MG tablet Take 1 tablet (25 mg total) by mouth daily.  . metFORMIN (GLUCOPHAGE) 1000 MG tablet TAKE 1/2 TABLET BY MOUTH TWICE A DAY  . Multiple Vitamins-Minerals (CENTRUM SILVER ULTRA WOMENS) TABS Take 1 tablet by mouth every morning.  . simvastatin (ZOCOR) 20 MG tablet TAKE 1 TABLET AT BEDTIME   No facility-administered encounter medications on file as of 01/11/2016.   ALLERGIES: No Known Allergies VACCINATION STATUS:  There is no immunization history on file for this patient.  Diabetes She presents for her follow-up diabetic visit. She has type 2 diabetes mellitus. Onset time: She was diagnosed at approximate age of 60 years. Her disease course has been stable (She has improved her A1c from 18.8% to 5.6% !). There are no hypoglycemic associated symptoms. Pertinent negatives for hypoglycemia include no confusion, headaches, pallor or seizures. There are no diabetic associated  symptoms. Pertinent negatives for diabetes include no chest pain, no polydipsia, no polyphagia and no polyuria. There are no hypoglycemic complications. Symptoms are stable. There are no diabetic complications. Risk factors for coronary artery disease include diabetes mellitus, dyslipidemia, hypertension, obesity and sedentary lifestyle. Current diabetic treatment includes oral agent (monotherapy). She is compliant with treatment most of the time. Her weight is decreasing steadily. She is following a diabetic diet. When asked about meal planning, she reported none. She has had a previous visit with a dietitian. She participates in exercise intermittently. An ACE inhibitor/angiotensin II receptor blocker is being taken.  Hyperlipidemia This is a chronic problem. The current episode started more than 1 year ago. The problem is controlled. Exacerbating diseases include diabetes. Pertinent negatives include no chest pain, focal sensory loss, myalgias or shortness of breath. Current antihyperlipidemic treatment includes statins.  Hypertension This is a chronic problem. The current episode started more than 1 year ago. The problem is uncontrolled (She ran out of her hydrochlorothiazide.). Pertinent negatives include no chest pain, headaches, palpitations or shortness of breath. Risk factors for coronary artery disease include diabetes mellitus, obesity and sedentary lifestyle. Past treatments include ACE inhibitors and diuretics. The current treatment provides mild improvement. Compliance problems include medication cost.      Review of Systems  Constitutional: Negative for fever, chills and unexpected weight change.  HENT: Negative for trouble swallowing and voice change.   Eyes: Negative for visual disturbance.  Respiratory: Negative for cough, shortness of breath and wheezing.   Cardiovascular: Negative for chest pain, palpitations and leg swelling.  Gastrointestinal: Negative for nausea, vomiting and  diarrhea.  Endocrine: Negative for cold intolerance, heat intolerance, polydipsia, polyphagia and polyuria.  Musculoskeletal: Negative for myalgias and arthralgias.  Skin: Negative for color change, pallor, rash and wound.  Neurological: Negative for seizures and headaches.  Psychiatric/Behavioral: Negative for suicidal ideas and confusion.    Objective:    BP 178/95 mmHg  Pulse 76  Ht 5\' 9"  (1.753 m)  Wt 290 lb (131.543 kg)  BMI 42.81 kg/m2  Wt Readings from Last 3 Encounters:  01/11/16 290 lb (131.543 kg)  01/11/16 290 lb (131.543 kg)  09/06/15 278 lb (126.1 kg)    Physical Exam  Constitutional: She is oriented to person, place, and time. She appears well-developed.  HENT:  Head: Normocephalic and atraumatic.  Eyes: EOM are normal.  Neck: Normal range of motion. Neck supple. No tracheal deviation present. No thyromegaly present.  Cardiovascular: Normal rate and regular rhythm.   Pulmonary/Chest: Effort normal and breath sounds normal.  Abdominal: Soft. Bowel sounds are normal. There is no tenderness. There is no guarding.  Musculoskeletal: Normal range of motion. She exhibits no edema.  Neurological: She is alert and oriented to person, place, and time. She has normal reflexes. No cranial nerve deficit. Coordination normal.  Skin: Skin is warm and dry. No rash noted. No erythema. No pallor.  Psychiatric: She has a normal mood and affect. Judgment normal.     CMP ( most recent) CMP     Component Value Date/Time   NA 140 01/02/2016 0950   K 4.2 01/02/2016 0950   CL 105 01/02/2016 0950   CO2 26 01/02/2016 0950   GLUCOSE 92 01/02/2016 0950   BUN 11 01/02/2016 0950   CREATININE 0.67 01/02/2016 0950   CREATININE 0.61 07/31/2013 0653   CALCIUM 9.0 01/02/2016 0950   PROT 7.2 07/28/2013 0409   ALBUMIN 3.2* 07/28/2013 0409   AST 14 07/28/2013 0409   ALT 18 07/28/2013 0409   ALKPHOS 100 07/28/2013 0409   BILITOT 0.3 07/28/2013 0409   GFRNONAA >90 07/31/2013 0653   GFRAA  >90 07/31/2013 0653     Diabetic Labs (most recent): Lab Results  Component Value Date   HGBA1C 5.7* 01/02/2016   HGBA1C 5.6 07/05/2015   HGBA1C 5.7 03/02/2015       Assessment & Plan:   1. Uncontrolled type 2 diabetes mellitus without complication, without long-term current use of insulin (HCC)  - patient remains at a high risk for more acute and chronic complications of diabetes which include CAD, CVA, CKD, retinopathy, and neuropathy. These are all discussed in detail with the patient.  Patient came with stable A1c of 5.7 %.   Recent labs reviewed.   - I have re-counseled the patient on diet managemweight loss   by adopting a carbohydrate restricted / protein rich  Diet.  - Suggestion is made for patient to avoid simple carbohydrates   from their diet including Cakes , Desserts, Ice Cream,  Soda (  diet and regular) , Sweet Tea , Candies,  Chips, Cookies, Artificial Sweeteners,   and "Sugar-free" Products .  This will help patient to have stable blood glucose profile and potentially avoid unintended  Weight gain.  - Patient is advised to stick to a routine mealtimes to eat 3 meals  a day and avoid unnecessary snacks ( to snack only to correct hypoglycemia).  - The  patient has beenscheduled with Norm Salt, RDN, CDE for individualized DM education.  - I have approached patient with the following individualized plan to manage diabetes and patient agrees.  -  she will not need insulin treatment for now . - I will  lower the dose of her metformin to 500 mg by mouth twice a day , therapeutically suitable for patient.  - Patient will be considered for incretin therapy as appropriate next visit. - Patient specific target  for A1c; LDL, HDL, Triglycerides, and  Waist Circumference were discussed in detail.  2) BP/HTN: uncontrolledContinue current medications including ACEI/ARB. I have increased her hydrochlorothiazide to 25 mg by mouth daily. She Did not take her blood pressure  medications this morning. I advised her to take her blood pressure medications in the morning every day.  3) Lipids/HPL:  continue statins. 4)  Weight/Diet: CDE consult in progress, exercise, and carbohydrates information provided.  5) Chronic Care/Health Maintenance:  -Patient is on ACEI/ARB and Statin medications and encouraged to continue to follow up with Ophthalmology, Podiatrist at least yearly or according to recommendations, and advised to  stay away from smoking. I have recommended yearly flu vaccine and pneumonia vaccination at least every 5 years; moderate intensity exercise for up to 150 minutes weekly; and  sleep for at least 7 hours a day.  - 25 minutes of time was spent on the care of this patient , 50% of which was applied for counseling on diabetes complications and their preventions.  - I advised patient to maintain close follow up with HAWKINS,EDWARD L, MD for primary care needs.  Patient is asked to bring meter and  blood glucose logs during their next visit.   Follow up plan: -Return in about 6 months (around 07/12/2016) for follow up with pre-visit labs.  Marquis Lunch, MD Phone: [phone]  Fax: [phone]   01/11/2016, 9:58 AM

## 2016-01-11 NOTE — Progress Notes (Signed)
  Medical Nutrition Therapy:  Appt start time: 1130 end time:  1145  Assessment:  Assessment:  Primary concerns today: Diabetes.   [facility] most days of the week. Metformin 500 mg BID  Wondering why she isn't losing weigh fast.  A1C 5.7%. Doing very well with diet. Needs more cardio and resistant [facility]. Would benefit from a little more protein with meals.  Lab Results  Component Value Date   HGBA1C 5.7* 01/02/2016    Preferred Learning Style:     No preference indicated   Learning Readiness:   Ready  Change in progress  MEDICATIONS: see list   DIETARY INTAKE:  24-hr recall:  B ( AM): Oatmeal and boiled egg, and fruit Snk ( AM): none L ( PM):  Broccoli, carrots, green beans, and meat, D ( PM): Fish,  Cottage cheese and string beans  Snk ( PM):   Beverages: water  Usual physical activity:  [facility] times per week and walking in the pool  Estimated energy needs: 1800 calories 200 g carbohydrates 135 g protein 50 g fat  Progress Towards Goal(s):  In progress.   Nutritional Diagnosis:  NB-1.1 Food and nutrition-related knowledge deficit As related to Diabetes.  As evidenced by A1C that was 18% and now down to 5.5% .    Intervention:  Nutrition counseling and diabetes education on meal planning with My Plate, carb counting, portion sizes, weight loss tips, benefits of exercise and prevention of complications from DM.   Plan:  Keep up the GREAT JOB!! Increase fresh fruits and vegetables. Increase protein with meals. [facility] about setting up a routine using weights. [facility]5 times per week. Lose 1 lb per week til next viist Keep A1C less than 5.7%  Teaching Method Utilized:Visual Auditory Hands on  Handouts given during visit include: The Plate Method Meal Plan Card  Barriers to  learning/adherence to lifestyle change: none  Demonstrated degree of understanding via:  Teach Back   Monitoring/Evaluation:  Dietary intake, exercise, meal planning, and body weight in 6 month(s) to continue to work on weight loss. .Marland Kitchen

## 2016-01-29 ENCOUNTER — Other Ambulatory Visit: Payer: Self-pay | Admitting: "Endocrinology

## 2016-02-02 ENCOUNTER — Other Ambulatory Visit: Payer: Self-pay

## 2016-02-02 MED ORDER — GLUCOSE BLOOD VI STRP: ORAL_STRIP | Status: DC

## 2016-03-01 ENCOUNTER — Other Ambulatory Visit: Payer: Self-pay | Admitting: "Endocrinology

## 2016-03-27 DIAGNOSIS — E119 Type 2 diabetes mellitus without complications: Secondary | ICD-10-CM | POA: Diagnosis not present

## 2016-03-27 DIAGNOSIS — I10 Essential (primary) hypertension: Secondary | ICD-10-CM | POA: Diagnosis not present

## 2016-03-27 DIAGNOSIS — M545 Low back pain: Secondary | ICD-10-CM | POA: Diagnosis not present

## 2016-04-24 ENCOUNTER — Other Ambulatory Visit: Payer: Self-pay | Admitting: "Endocrinology

## 2016-05-13 ENCOUNTER — Other Ambulatory Visit: Payer: Self-pay

## 2016-05-13 MED ORDER — SIMVASTATIN 20 MG PO TABS: 20.0000 mg | ORAL_TABLET | Freq: Every day | ORAL | 0 refills | Status: DC

## 2016-05-13 MED ORDER — METFORMIN HCL 1000 MG PO TABS: 500.0000 mg | ORAL_TABLET | Freq: Two times a day (BID) | ORAL | 0 refills | Status: DC

## 2016-06-22 ENCOUNTER — Other Ambulatory Visit: Payer: Self-pay | Admitting: "Endocrinology

## 2016-07-10 ENCOUNTER — Other Ambulatory Visit: Payer: Self-pay | Admitting: "Endocrinology

## 2016-07-10 LAB — COMPLETE METABOLIC PANEL WITH GFR
ALT: 16 U/L (ref 6–29)
AST: 17 U/L (ref 10–35)
Albumin: 4 g/dL (ref 3.6–5.1)
Alkaline Phosphatase: 77 U/L (ref 33–130)
BILIRUBIN TOTAL: 0.6 mg/dL (ref 0.2–1.2)
BUN: 12 mg/dL (ref 7–25)
CALCIUM: 9 mg/dL (ref 8.6–10.4)
CHLORIDE: 105 mmol/L (ref 98–110)
CO2: 27 mmol/L (ref 20–31)
CREATININE: 0.63 mg/dL (ref 0.50–0.99)
GFR, Est Non African American: >89 mL/min (ref >=60)
Glucose, Bld: 120 mg/dL — ABNORMAL HIGH (ref 65–99)
Potassium: 4.1 mmol/L (ref 3.5–5.3)
Sodium: 141 mmol/L (ref 135–146)
TOTAL PROTEIN: 6.9 g/dL (ref 6.1–8.1)

## 2016-07-10 LAB — LIPID PANEL
CHOLESTEROL: 170 mg/dL (ref <200)
HDL: 45 mg/dL — ABNORMAL LOW (ref >50)
LDL CALC: 80 mg/dL (ref <100)
TRIGLYCERIDES: 223 mg/dL — AB (ref <150)
Total CHOL/HDL Ratio: 3.8 Ratio (ref <5.0)
VLDL: 45 mg/dL — AB (ref <30)

## 2016-07-10 LAB — HEMOGLOBIN A1C
Hgb A1c MFr Bld: 5.5 % (ref <5.7)
MEAN PLASMA GLUCOSE: 111 mg/dL

## 2016-07-18 ENCOUNTER — Encounter: Payer: Medicare Other | Attending: "Endocrinology | Admitting: Nutrition

## 2016-07-18 ENCOUNTER — Ambulatory Visit (INDEPENDENT_AMBULATORY_CARE_PROVIDER_SITE_OTHER): Payer: Medicare Other | Admitting: "Endocrinology

## 2016-07-18 ENCOUNTER — Other Ambulatory Visit: Payer: Self-pay | Admitting: "Endocrinology

## 2016-07-18 ENCOUNTER — Encounter: Payer: Self-pay | Admitting: "Endocrinology

## 2016-07-18 VITALS — Ht 69.0 in | Wt 296.0 lb

## 2016-07-18 VITALS — BP 164/90 | HR 81 | Ht 69.0 in | Wt 296.0 lb

## 2016-07-18 DIAGNOSIS — E782 Mixed hyperlipidemia: Secondary | ICD-10-CM | POA: Diagnosis not present

## 2016-07-18 DIAGNOSIS — E6609 Other obesity due to excess calories: Secondary | ICD-10-CM | POA: Diagnosis not present

## 2016-07-18 DIAGNOSIS — E1165 Type 2 diabetes mellitus with hyperglycemia: Secondary | ICD-10-CM

## 2016-07-18 DIAGNOSIS — Z6841 Body Mass Index (BMI) 40.0 and over, adult: Secondary | ICD-10-CM | POA: Diagnosis not present

## 2016-07-18 DIAGNOSIS — E119 Type 2 diabetes mellitus without complications: Secondary | ICD-10-CM | POA: Diagnosis not present

## 2016-07-18 DIAGNOSIS — I10 Essential (primary) hypertension: Secondary | ICD-10-CM

## 2016-07-18 DIAGNOSIS — IMO0001 Reserved for inherently not codable concepts without codable children: Secondary | ICD-10-CM

## 2016-07-18 DIAGNOSIS — Z713 Dietary counseling and surveillance: Secondary | ICD-10-CM | POA: Insufficient documentation

## 2016-07-18 DIAGNOSIS — E118 Type 2 diabetes mellitus with unspecified complications: Principal | ICD-10-CM

## 2016-07-18 MED ORDER — METFORMIN HCL 500 MG PO TABS: 500.0000 mg | ORAL_TABLET | Freq: Two times a day (BID) | ORAL | 6 refills | Status: DC

## 2016-07-18 MED ORDER — VITAMIN D3 125 MCG (5000 UT) PO CAPS: 5000.0000 [IU] | ORAL_CAPSULE | Freq: Every day | ORAL | 0 refills | Status: DC

## 2016-07-18 NOTE — Progress Notes (Signed)
Subjective:    Patient ID: Karen Rios, female    DOB: Jun 05, 1956, PCP Fredirick Maudlin, MD   Past Medical History:  Diagnosis Date  . Arthritis   . Diabetes mellitus without complication (HCC)   . Hypertension    Past Surgical History:  Procedure Laterality Date  . TUBAL LIGATION     Social History   Social History  . Marital status: Married    Spouse name: N/A  . Number of children: N/A  . Years of education: N/A   Social History Main Topics  . Smoking status: Never Smoker  . Smokeless tobacco: Never Used  . Alcohol use No  . Drug use: No  . Sexual activity: Not Asked   Other Topics Concern  . None   Social History Narrative  . None   Outpatient Encounter Prescriptions as of 07/18/2016  Medication Sig  . ACCU-CHEK AVIVA PLUS test strip USE ONE STRIP 4 TIMES A DAY.  . benazepril (LOTENSIN) 40 MG tablet TAKE 1 TABLET BY MOUTH EVERY DAY  . Cholecalciferol (VITAMIN D3) 5000 units CAPS Take 1 capsule (5,000 Units total) by mouth daily.  . furosemide (LASIX) 40 MG tablet Take 40 mg by mouth every morning.  . hydrochlorothiazide (HYDRODIURIL) 25 MG tablet TAKE 1 TABLET (25 MG TOTAL) BY MOUTH DAILY.  . metFORMIN (GLUCOPHAGE) 500 MG tablet Take 1 tablet (500 mg total) by mouth 2 (two) times daily with a meal.  . Multiple Vitamins-Minerals (CENTRUM SILVER ULTRA WOMENS) TABS Take 1 tablet by mouth every morning.  . simvastatin (ZOCOR) 20 MG tablet Take 1 tablet (20 mg total) by mouth at bedtime.  . [DISCONTINUED] metFORMIN (GLUCOPHAGE) 1000 MG tablet Take 0.5 tablets (500 mg total) by mouth 2 (two) times daily.   No facility-administered encounter medications on file as of 07/18/2016.    ALLERGIES: No Known Allergies VACCINATION STATUS:  There is no immunization history on file for this patient.  Diabetes  She presents for her follow-up diabetic visit. She has type 2 diabetes mellitus. Onset time: She was diagnosed at approximate age of 35 years. Her disease  course has been stable (She has improved her A1c from 18.8% to 5.6% !). There are no hypoglycemic associated symptoms. Pertinent negatives for hypoglycemia include no confusion, headaches, pallor or seizures. There are no diabetic associated symptoms. Pertinent negatives for diabetes include no chest pain, no polydipsia, no polyphagia and no polyuria. There are no hypoglycemic complications. Symptoms are stable. There are no diabetic complications. Risk factors for coronary artery disease include diabetes mellitus, dyslipidemia, hypertension, obesity and sedentary lifestyle. Current diabetic treatment includes oral agent (monotherapy). She is compliant with treatment most of the time. Her weight is decreasing steadily. She is following a diabetic diet. When asked about meal planning, she reported none. She has had a previous visit with a dietitian. She participates in exercise intermittently. An ACE inhibitor/angiotensin II receptor blocker is being taken.  Hyperlipidemia  This is a chronic problem. The current episode started more than 1 year ago. The problem is controlled. Exacerbating diseases include diabetes. Pertinent negatives include no chest pain, focal sensory loss, myalgias or shortness of breath. Current antihyperlipidemic treatment includes statins.  Hypertension  This is a chronic problem. The current episode started more than 1 year ago. The problem is uncontrolled (She ran out of her hydrochlorothiazide.). Pertinent negatives include no chest pain, headaches, palpitations or shortness of breath. Risk factors for coronary artery disease include diabetes mellitus, obesity and sedentary lifestyle. Past treatments include  ACE inhibitors and diuretics. The current treatment provides mild improvement. Compliance problems include medication cost.      Review of Systems  Constitutional: Negative for chills, fever and unexpected weight change.  HENT: Negative for trouble swallowing and voice change.    Eyes: Negative for visual disturbance.  Respiratory: Negative for cough, shortness of breath and wheezing.   Cardiovascular: Negative for chest pain, palpitations and leg swelling.  Gastrointestinal: Negative for diarrhea, nausea and vomiting.  Endocrine: Negative for cold intolerance, heat intolerance, polydipsia, polyphagia and polyuria.  Musculoskeletal: Negative for arthralgias and myalgias.  Skin: Negative for color change, pallor, rash and wound.  Neurological: Negative for seizures and headaches.  Psychiatric/Behavioral: Negative for confusion and suicidal ideas.    Objective:    BP (!) 164/90   Pulse 81   Ht 5\' 9"  (1.753 m)   Wt 296 lb (134.3 kg)   BMI 43.71 kg/m   Wt Readings from Last 3 Encounters:  07/18/16 296 lb (134.3 kg)  01/11/16 290 lb (131.5 kg)  01/11/16 290 lb (131.5 kg)    Physical Exam  Constitutional: She is oriented to person, place, and time. She appears well-developed.  HENT:  Head: Normocephalic and atraumatic.  Eyes: EOM are normal.  Neck: Normal range of motion. Neck supple. No tracheal deviation present. No thyromegaly present.  Cardiovascular: Normal rate and regular rhythm.   Pulmonary/Chest: Effort normal and breath sounds normal.  Abdominal: Soft. Bowel sounds are normal. There is no tenderness. There is no guarding.  Musculoskeletal: Normal range of motion. She exhibits no edema.  Neurological: She is alert and oriented to person, place, and time. She has normal reflexes. No cranial nerve deficit. Coordination normal.  Skin: Skin is warm and dry. No rash noted. No erythema. No pallor.  Psychiatric: She has a normal mood and affect. Judgment normal.     CMP ( most recent) CMP     Component Value Date/Time   NA 141 07/10/2016 0708   K 4.1 07/10/2016 0708   CL 105 07/10/2016 0708   CO2 27 07/10/2016 0708   GLUCOSE 120 (H) 07/10/2016 0708   BUN 12 07/10/2016 0708   CREATININE 0.63 07/10/2016 0708   CALCIUM 9.0 07/10/2016 0708    PROT 6.9 07/10/2016 0708   ALBUMIN 4.0 07/10/2016 0708   AST 17 07/10/2016 0708   ALT 16 07/10/2016 0708   ALKPHOS 77 07/10/2016 0708   BILITOT 0.6 07/10/2016 0708   GFRNONAA >89 07/10/2016 0708   GFRAA >89 07/10/2016 0708     Diabetic Labs (most recent): Lab Results  Component Value Date   HGBA1C 5.5 07/10/2016   HGBA1C 5.7 (H) 01/02/2016   HGBA1C 5.6 07/05/2015       Assessment & Plan:   1. Uncontrolled type 2 diabetes mellitus without complication, without long-term current use of insulin (HCC)  - patient remains at a high risk for more acute and chronic complications of diabetes which include CAD, CVA, CKD, retinopathy, and neuropathy. These are all discussed in detail with the patient.  Patient came with stable A1c of 5.5 %.   Recent labs reviewed.   - I have re-counseled the patient on diet managemweight loss   by adopting a carbohydrate restricted / protein rich  Diet.  - Suggestion is made for patient to avoid simple carbohydrates   from their diet including Cakes , Desserts, Ice Cream,  Soda (  diet and regular) , Sweet Tea , Candies,  Chips, Cookies, Artificial Sweeteners,   and "Sugar-free" Products .  This will help patient to have stable blood glucose profile and potentially avoid unintended  Weight gain.  - Patient is advised to stick to a routine mealtimes to eat 3 meals  a day and avoid unnecessary snacks ( to snack only to correct hypoglycemia).  - The patient has beenscheduled with Norm SaltPenny Crumpton, RDN, CDE for individualized DM education.  - I have approached patient with the following individualized plan to manage diabetes and patient agrees.  - I will  Continue low dose of metformin  500 mg by mouth twice a day , therapeutically suitable for patient.  - Patient will be considered for incretin therapy as appropriate next visit. - Patient specific target  for A1c; LDL, HDL, Triglycerides, and  Waist Circumference were discussed in detail.  2) BP/HTN:  uncontrolledContinue current medications including ACEI/ARB.  She has benazepril 40 mg by mouth daily and HCTZ 25 mg by mouth daily.   She Did not take her blood pressure medications this morning. I advised her to take her blood pressure medications in the morning every day.  3) Lipids/HPL:  continue statins. 4)  Weight/Diet: CDE consult in progress, exercise, and carbohydrates information provided.  5) Chronic Care/Health Maintenance:  -Patient is on ACEI/ARB and Statin medications and encouraged to continue to follow up with Ophthalmology, Podiatrist at least yearly or according to recommendations, and advised to  stay away from smoking. I have recommended yearly flu vaccine and pneumonia vaccination at least every 5 years; moderate intensity exercise for up to 150 minutes weekly; and  sleep for at least 7 hours a day.  - 25 minutes of time was spent on the care of this patient , 50% of which was applied for counseling on diabetes complications and their preventions.  - I advised patient to maintain close follow up with HAWKINS,EDWARD L, MD for primary care needs.  Patient is asked to bring meter and  blood glucose logs during their next visit.   Follow up plan: -Return in about 4 months (around 11/15/2016) for follow up with pre-visit labs.  Marquis LunchGebre Britney Newstrom, MD Phone: [phone]  Fax: [phone]   07/18/2016, 10:05 AM

## 2016-07-18 NOTE — Patient Instructions (Signed)

## 2016-07-18 NOTE — Patient Instructions (Signed)
Goals Keep up the good job! Increase physical actvity to 60 minutes  A day[facility] along with water aerobics. Start taking VItamin D 5000 mg per day Increase your protein intake with meals Keep eating your vegetables, Avoid processed foods Lose 1-2 lbs per week

## 2016-07-18 NOTE — Progress Notes (Signed)
  Medical Nutrition Therapy:  Appt start time: 1000 end time:  1015 Assessment:  Assessment:  Primary concerns today: Diabetes.   Metformin 500 mg BID. Admits to eating some snacks tha she normally doesn't. . [facility] due to cold weather. Lost her sister in Nov 2017  Due to Cancer and feels like she has been eating more after that. [facility] and do water aerobics and use equipment. Wants to lose weight. A1C good at 5.5%.   Diet remains excessive in calories as shown by weight gain.   Lab Results  Component Value Date   HGBA1C 5.5 07/10/2016    Preferred Learning Style:     No preference indicated   Learning Readiness:   Ready  Change in progress  MEDICATIONS: see list   DIETARY INTAKE:  24-hr recall:  B ( AM): Oatmeal boiled egg and fruit, water  Snk ( AM): none L ( PM): string beans, cauliflower,corn and beef.water D ( PM): Broccoli, string beans, apple, beef, water Snk ( PM): PB and crackers sometimes.  Beverages: water  Usual physical activity:  [facility] per week and walking in the pool  Estimated energy needs: 1800 calories 200 g carbohydrates 135 g protein 50 g fat  Progress Towards Goal(s):  In progress.   Nutritional Diagnosis:  NB-1.1 Food and nutrition-related knowledge deficit As related to Diabetes.  As evidenced by A1C that was 18% and now down to 5.5% .    Intervention:  Nutrition counseling and diabetes education on meal planning with My Plate, carb counting, portion sizes, weight loss tips, benefits of exercise and prevention of complications from DM.  Weight loss tips and increasing physical activity. Avoiding salty processed foods.  Goals Keep up the good job! Increase physical actvity to 60 minutes  A [facility]--[facility] along with water aerobics. Start taking VItamin D 5000 mg per day Increase your protein intake with meals Keep eating your  vegetables, Avoid processed foods Lose 1-2 lbs per week  Teaching Method Utilized:Visual Auditory Hands on  Handouts given during visit include: The Plate Method Meal Plan Card  Barriers to learning/adherence to lifestyle change: none  Demonstrated degree of understanding via:  Teach Back   Monitoring/Evaluation:  Dietary intake, exercise, meal planning, and body weight in 6 month(s) to continue to work on weight loss. .Marland Kitchen

## 2016-09-03 DIAGNOSIS — I10 Essential (primary) hypertension: Secondary | ICD-10-CM | POA: Diagnosis not present

## 2016-09-03 DIAGNOSIS — E119 Type 2 diabetes mellitus without complications: Secondary | ICD-10-CM | POA: Diagnosis not present

## 2016-09-03 DIAGNOSIS — M545 Low back pain: Secondary | ICD-10-CM | POA: Diagnosis not present

## 2016-09-17 ENCOUNTER — Other Ambulatory Visit: Payer: Self-pay

## 2016-09-17 MED ORDER — ACCU-CHEK AVIVA PLUS W/DEVICE KIT: PACK | 0 refills | Status: DC

## 2016-09-18 ENCOUNTER — Other Ambulatory Visit: Payer: Self-pay | Admitting: "Endocrinology

## 2016-09-22 ENCOUNTER — Other Ambulatory Visit: Payer: Self-pay | Admitting: "Endocrinology

## 2016-11-14 ENCOUNTER — Other Ambulatory Visit: Payer: Self-pay | Admitting: "Endocrinology

## 2016-11-14 DIAGNOSIS — E119 Type 2 diabetes mellitus without complications: Secondary | ICD-10-CM | POA: Diagnosis not present

## 2016-11-14 DIAGNOSIS — E559 Vitamin D deficiency, unspecified: Secondary | ICD-10-CM | POA: Diagnosis not present

## 2016-11-14 LAB — COMPREHENSIVE METABOLIC PANEL
ALBUMIN: 4.1 g/dL (ref 3.6–5.1)
ALT: 14 U/L (ref 6–29)
AST: 17 U/L (ref 10–35)
Alkaline Phosphatase: 66 U/L (ref 33–130)
BILIRUBIN TOTAL: 0.5 mg/dL (ref 0.2–1.2)
BUN: 15 mg/dL (ref 7–25)
CALCIUM: 9.4 mg/dL (ref 8.6–10.4)
CHLORIDE: 104 mmol/L (ref 98–110)
CO2: 26 mmol/L (ref 20–31)
Creat: 0.76 mg/dL (ref 0.50–0.99)
Glucose, Bld: 121 mg/dL — ABNORMAL HIGH (ref 65–99)
POTASSIUM: 4.1 mmol/L (ref 3.5–5.3)
Sodium: 140 mmol/L (ref 135–146)
Total Protein: 7.2 g/dL (ref 6.1–8.1)

## 2016-11-14 LAB — T4, FREE: FREE T4: 1.4 ng/dL (ref 0.8–1.8)

## 2016-11-14 LAB — TSH: TSH: 1.03 mIU/L

## 2016-11-15 LAB — MICROALBUMIN / CREATININE URINE RATIO
CREATININE, URINE: 280 mg/dL (ref 20–320)
Microalb Creat Ratio: 32 mcg/mg creat — ABNORMAL HIGH (ref <30)
Microalb, Ur: 8.9 mg/dL

## 2016-11-15 LAB — HEMOGLOBIN A1C
HEMOGLOBIN A1C: 5.6 % (ref <5.7)
MEAN PLASMA GLUCOSE: 114 mg/dL

## 2016-11-15 LAB — VITAMIN D 25 HYDROXY (VIT D DEFICIENCY, FRACTURES): Vit D, 25-Hydroxy: 58 ng/mL (ref 30–100)

## 2016-11-20 ENCOUNTER — Ambulatory Visit (INDEPENDENT_AMBULATORY_CARE_PROVIDER_SITE_OTHER): Payer: Medicare Other | Admitting: "Endocrinology

## 2016-11-20 ENCOUNTER — Encounter: Payer: Self-pay | Admitting: "Endocrinology

## 2016-11-20 ENCOUNTER — Encounter: Payer: Medicare Other | Attending: Pulmonary Disease | Admitting: Nutrition

## 2016-11-20 VITALS — Wt 288.0 lb

## 2016-11-20 VITALS — BP 159/88 | HR 83 | Ht 69.0 in | Wt 288.0 lb

## 2016-11-20 DIAGNOSIS — I10 Essential (primary) hypertension: Secondary | ICD-10-CM | POA: Diagnosis not present

## 2016-11-20 DIAGNOSIS — E782 Mixed hyperlipidemia: Secondary | ICD-10-CM | POA: Diagnosis not present

## 2016-11-20 DIAGNOSIS — E119 Type 2 diabetes mellitus without complications: Secondary | ICD-10-CM

## 2016-11-20 DIAGNOSIS — Z713 Dietary counseling and surveillance: Secondary | ICD-10-CM | POA: Diagnosis not present

## 2016-11-20 NOTE — Progress Notes (Signed)
  Medical Nutrition Therapy:  Appt start time: 1015 end time:  1030 Assessment:  Assessment:  Primary concerns today: Diabetes Type 2. Lost 8 lbs.  Metformin 500 . A1C 5.6%. FBS 100's in am. Feels great. Eating well balanced meals.  Going to [facility] daily and does water aerobics and use equipment. Working on weight loss. Making excellent progress  Lab Results  Component Value Date   HGBA1C 5.6 11/14/2016    Preferred Learning Style:     No preference indicated   Learning Readiness:   Ready  Change in progress  MEDICATIONS: see list   DIETARY INTAKE:  24-hr recall:  B ( AM): Oatmeal boiled egg and fruit, water  Snk ( AM): none L ( PM): Broccoli, corn, beans, apple, water, 4 oz mlk, and tuna D ( PM): same as dinner Snk ( PM): water Beverages: water  Usual physical activity:  Goinng to [facility] for water aerobics 4 times per week and walking in the pool  Estimated energy needs: 1800 calories 200 g carbohydrates 135 g protein 50 g fat  Progress Towards Goal(s):  In progress.   Nutritional Diagnosis:  NB-1.1 Food and nutrition-related knowledge deficit As related to Diabetes.  As evidenced by A1C that was 18% and now down to 5.5% .    Intervention:  Nutrition counseling and diabetes education on meal planning with My Plate, carb counting, portion sizes, weight loss tips, benefits of exercise and prevention of complications from DM.  Weight loss tips and increasing physical activity. Avoiding salty processed foods.  Keep up the great job Eat 3 meals per day Lose 2 lbs per month Keep A1C 5.6% or less Continue taking 500 mg of Metformin twice a day  Teaching Method Utilized:Visual Auditory Hands on  Handouts given during visit include: The Plate Method Meal Plan Card  Barriers to learning/adherence to lifestyle change: none  Demonstrated degree of understanding via:  Teach Back   Monitoring/Evaluation:  Dietary intake, exercise, meal planning, and body weight in 6  month(s) to continue to work on weight loss. .Marland Kitchen

## 2016-11-20 NOTE — Progress Notes (Signed)
Subjective:    Patient ID: Karen Rios, female    DOB: 1956-01-13, PCP Sinda Du, MD   Past Medical History:  Diagnosis Date  . Arthritis   . Diabetes mellitus without complication ([place])   . Hypertension    Past Surgical History:  Procedure Laterality Date  . TUBAL LIGATION     Social History   Social History  . Marital status: Married    Spouse name: N/A  . Number of children: N/A  . Years of education: N/A   Social History Main Topics  . Smoking status: Never Smoker  . Smokeless tobacco: Never Used  . Alcohol use No  . Drug use: No  . Sexual activity: Not Asked   Other Topics Concern  . None   Social History Narrative  . None   Outpatient Encounter Prescriptions as of 11/20/2016  Medication Sig  . ACCU-CHEK AVIVA PLUS test strip USE ONE STRIP 4 TIMES A DAY.  . benazepril (LOTENSIN) 40 MG tablet TAKE 1 TABLET BY MOUTH EVERY DAY  . Blood Glucose Monitoring Suppl (ACCU-CHEK AVIVA PLUS) w/Device KIT Test BG 4 x daily. e11.65  . [facility] D3 5000 units capsule TAKE 1 CAPSULE (5,000 UNITS TOTAL) BY MOUTH DAILY.  . furosemide (LASIX) 40 MG tablet Take 40 mg by mouth every morning.  . hydrochlorothiazide (HYDRODIURIL) 25 MG tablet TAKE 1 TABLET BY MOUTH DAILY.  . metFORMIN (GLUCOPHAGE) 500 MG tablet Take 1 tablet (500 mg total) by mouth 2 (two) times daily with a meal.  . Multiple Vitamins-Minerals (CENTRUM SILVER ULTRA WOMENS) TABS Take 1 tablet by mouth every morning.  . simvastatin (ZOCOR) 20 MG tablet Take 1 tablet (20 mg total) by mouth at bedtime.   No facility-administered encounter medications on file as of 11/20/2016.    ALLERGIES: No Known Allergies VACCINATION STATUS:  There is no immunization history on file for this patient.  Diabetes  She presents for her follow-up diabetic visit. She has type 2 diabetes mellitus. Onset time: She was diagnosed at approximate age of 53 years. Her disease course has been stable (She has improved her A1c from 18.8%  to 5.6% !). There are no hypoglycemic associated symptoms. Pertinent negatives for hypoglycemia include no confusion, headaches, pallor or seizures. There are no diabetic associated symptoms. Pertinent negatives for diabetes include no chest pain, no polydipsia, no polyphagia and no polyuria. There are no hypoglycemic complications. Symptoms are stable. There are no diabetic complications. Risk factors for coronary artery disease include diabetes mellitus, dyslipidemia, hypertension, obesity and sedentary lifestyle. Current diabetic treatment includes oral agent (monotherapy). She is compliant with treatment most of the time. Her weight is decreasing steadily. She is following a diabetic diet. When asked about meal planning, she reported none. She has had a previous visit with a dietitian. She participates in exercise intermittently. An ACE inhibitor/angiotensin II receptor blocker is being taken.  Hyperlipidemia  This is a chronic problem. The current episode started more than 1 year ago. The problem is controlled. Exacerbating diseases include diabetes. Pertinent negatives include no chest pain, focal sensory loss, myalgias or shortness of breath. Current antihyperlipidemic treatment includes statins.  Hypertension  This is a chronic problem. The current episode started more than 1 year ago. The problem is uncontrolled (She ran out of her hydrochlorothiazide.). Pertinent negatives include no chest pain, headaches, palpitations or shortness of breath. Risk factors for coronary artery disease include diabetes mellitus, obesity and sedentary lifestyle. Past treatments include ACE inhibitors and diuretics. The current treatment  provides mild improvement. Compliance problems include medication cost.      Review of Systems  Constitutional: Negative for chills, fever and unexpected weight change.  HENT: Negative for trouble swallowing and voice change.   Eyes: Negative for visual disturbance.  Respiratory:  Negative for cough, shortness of breath and wheezing.   Cardiovascular: Negative for chest pain, palpitations and leg swelling.  Gastrointestinal: Negative for diarrhea, nausea and vomiting.  Endocrine: Negative for cold intolerance, heat intolerance, polydipsia, polyphagia and polyuria.  Musculoskeletal: Negative for arthralgias and myalgias.  Skin: Negative for color change, pallor, rash and wound.  Neurological: Negative for seizures and headaches.  Psychiatric/Behavioral: Negative for confusion and suicidal ideas.    Objective:    BP (!) 159/88   Pulse 83   Ht '5\' 9"'  (1.753 m)   Wt 288 lb (130.6 kg)   BMI 42.53 kg/m   Wt Readings from Last 3 Encounters:  11/20/16 288 lb (130.6 kg)  07/18/16 296 lb (134.3 kg)  07/18/16 296 lb (134.3 kg)    Physical Exam  Constitutional: She is oriented to person, place, and time. She appears well-developed.  HENT:  Head: Normocephalic and atraumatic.  Eyes: EOM are normal.  Neck: Normal range of motion. Neck supple. No tracheal deviation present. No thyromegaly present.  Cardiovascular: Normal rate and regular rhythm.   Pulmonary/Chest: Effort normal and breath sounds normal.  Abdominal: Soft. Bowel sounds are normal. There is no tenderness. There is no guarding.  Musculoskeletal: Normal range of motion. She exhibits no edema.  Neurological: She is alert and oriented to person, place, and time. She has normal reflexes. No cranial nerve deficit. Coordination normal.  Skin: Skin is warm and dry. No rash noted. No erythema. No pallor.  Psychiatric: She has a normal mood and affect. Judgment normal.     CMP ( most recent) CMP     Component Value Date/Time   NA 140 11/14/2016 0720   K 4.1 11/14/2016 0720   CL 104 11/14/2016 0720   CO2 26 11/14/2016 0720   GLUCOSE 121 (H) 11/14/2016 0720   BUN 15 11/14/2016 0720   CREATININE 0.76 11/14/2016 0720   CALCIUM 9.4 11/14/2016 0720   PROT 7.2 11/14/2016 0720   ALBUMIN 4.1 11/14/2016 0720    AST 17 11/14/2016 0720   ALT 14 11/14/2016 0720   ALKPHOS 66 11/14/2016 0720   BILITOT 0.5 11/14/2016 0720   GFRNONAA >89 07/10/2016 0708   GFRAA >89 07/10/2016 0708     Diabetic Labs (most recent): Lab Results  Component Value Date   HGBA1C 5.6 11/14/2016   HGBA1C 5.5 07/10/2016   HGBA1C 5.7 (H) 01/02/2016       Assessment & Plan:   1. Uncontrolled type 2 diabetes mellitus without complication, without long-term current use of insulin ([place])  - patient remains at a high risk for more acute and chronic complications of diabetes which include CAD, CVA, CKD, retinopathy, and neuropathy. These are all discussed in detail with the patient.  Patient came with stable A1c of 5.6 %.   Recent labs reviewed.   - I have re-counseled the patient on diet managemweight loss   by adopting a carbohydrate restricted / protein rich  Diet.  - Suggestion is made for patient to avoid simple carbohydrates   from their diet including Cakes , Desserts, Ice Cream,  Soda (  diet and regular) , Sweet Tea , Candies,  Chips, Cookies, Artificial Sweeteners,   and "Sugar-free" Products .  This will help patient to  have stable blood glucose profile and potentially avoid unintended  Weight gain.  - Patient is advised to stick to a routine mealtimes to eat 3 meals  a day and avoid unnecessary snacks ( to snack only to correct hypoglycemia).  - The patient has beenscheduled with Jearld Fenton, RDN, CDE for individualized DM education.  - I have approached patient with the following individualized plan to manage diabetes and patient agrees.  - I will  Continue low dose of metformin  500 mg by mouth twice a day , therapeutically suitable for patient.  - Patient will be considered for incretin therapy  If she loses control .  - Patient specific target  for A1c; LDL, HDL, Triglycerides, and  Waist Circumference were discussed in detail.  2) BP/HTN: uncontrolledContinue current medications including ACEI/ARB.   She has benazepril 40 mg by mouth daily and HCTZ 25 mg by mouth daily.   She Did not take her blood pressure medications this morning. I advised her to take her blood pressure medications in the morning every day.  3) Lipids/HPL:  continue statins. 4)  Weight/Diet: CDE consult in progress, exercise, and carbohydrates information provided.  5) Chronic Care/Health Maintenance:  -Patient is on ACEI/ARB and Statin medications and encouraged to continue to follow up with Ophthalmology, Podiatrist at least yearly or according to recommendations, and advised to  stay away from smoking. I have recommended yearly flu vaccine and pneumonia vaccination at least every 5 years; moderate intensity exercise for up to 150 minutes weekly; and  sleep for at least 7 hours a day.  - 25 minutes of time was spent on the care of this patient , 50% of which was applied for counseling on diabetes complications and their preventions.  - I advised patient to maintain close follow up with Sinda Du, MD for primary care needs.  Patient is asked to bring meter and  blood glucose logs during their next visit.   Follow up plan: -Return in about 6 months (around 05/23/2017) for follow up with pre-visit labs.  Glade Lloyd, MD Phone: [phone]  Fax: [phone]   11/20/2016, 10:12 AM

## 2016-11-20 NOTE — Patient Instructions (Signed)
Keep up the great job Eat 3 meals per day Lose 2 lbs per month Keep A1C 5.6% or less Continue taking 500 mg of Metformin twice a day

## 2016-12-02 DIAGNOSIS — M1990 Unspecified osteoarthritis, unspecified site: Secondary | ICD-10-CM | POA: Diagnosis not present

## 2016-12-02 DIAGNOSIS — M545 Low back pain: Secondary | ICD-10-CM | POA: Diagnosis not present

## 2016-12-02 DIAGNOSIS — I10 Essential (primary) hypertension: Secondary | ICD-10-CM | POA: Diagnosis not present

## 2016-12-05 ENCOUNTER — Other Ambulatory Visit: Payer: Self-pay | Admitting: "Endocrinology

## 2017-01-07 ENCOUNTER — Other Ambulatory Visit: Payer: Self-pay | Admitting: "Endocrinology

## 2017-03-03 ENCOUNTER — Other Ambulatory Visit: Payer: Self-pay | Admitting: "Endocrinology

## 2017-03-31 ENCOUNTER — Other Ambulatory Visit: Payer: Self-pay

## 2017-03-31 MED ORDER — METFORMIN HCL 500 MG PO TABS: 500.0000 mg | ORAL_TABLET | Freq: Two times a day (BID) | ORAL | 0 refills | Status: DC

## 2017-03-31 MED ORDER — SIMVASTATIN 20 MG PO TABS: 20.0000 mg | ORAL_TABLET | Freq: Every day | ORAL | 0 refills | Status: DC

## 2017-03-31 MED ORDER — HYDROCHLOROTHIAZIDE 25 MG PO TABS: 25.0000 mg | ORAL_TABLET | Freq: Every day | ORAL | 0 refills | Status: DC

## 2017-03-31 MED ORDER — GLUCOSE BLOOD VI STRP: ORAL_STRIP | 5 refills | Status: DC

## 2017-05-16 DIAGNOSIS — E119 Type 2 diabetes mellitus without complications: Secondary | ICD-10-CM | POA: Diagnosis not present

## 2017-05-17 LAB — COMPLETE METABOLIC PANEL WITH GFR
AG RATIO: 1.4 (calc) (ref 1.0–2.5)
ALBUMIN MSPROF: 4 g/dL (ref 3.6–5.1)
ALT: 11 U/L (ref 6–29)
AST: 14 U/L (ref 10–35)
Alkaline phosphatase (APISO): 75 U/L (ref 33–130)
BILIRUBIN TOTAL: 0.6 mg/dL (ref 0.2–1.2)
BUN: 13 mg/dL (ref 7–25)
CALCIUM: 9.1 mg/dL (ref 8.6–10.4)
CHLORIDE: 105 mmol/L (ref 98–110)
CO2: 26 mmol/L (ref 20–32)
Creat: 0.66 mg/dL (ref 0.50–0.99)
GFR, EST AFRICAN AMERICAN: 110 mL/min/{1.73_m2} (ref >=60)
GFR, EST NON AFRICAN AMERICAN: 95 mL/min/{1.73_m2} (ref >=60)
GLOBULIN: 2.9 g/dL (ref 1.9–3.7)
Glucose, Bld: 110 mg/dL — ABNORMAL HIGH (ref 65–99)
POTASSIUM: 3.8 mmol/L (ref 3.5–5.3)
SODIUM: 142 mmol/L (ref 135–146)
TOTAL PROTEIN: 6.9 g/dL (ref 6.1–8.1)

## 2017-05-17 LAB — HEMOGLOBIN A1C
HEMOGLOBIN A1C: 5.7 %{Hb} — AB (ref <5.7)
MEAN PLASMA GLUCOSE: 117 (calc)
eAG (mmol/L): 6.5 (calc)

## 2017-05-23 ENCOUNTER — Ambulatory Visit (INDEPENDENT_AMBULATORY_CARE_PROVIDER_SITE_OTHER): Payer: Medicare Other | Admitting: "Endocrinology

## 2017-05-23 ENCOUNTER — Encounter: Payer: Self-pay | Admitting: "Endocrinology

## 2017-05-23 VITALS — BP 160/93 | HR 90 | Ht 69.0 in | Wt 291.0 lb

## 2017-05-23 DIAGNOSIS — I10 Essential (primary) hypertension: Secondary | ICD-10-CM

## 2017-05-23 DIAGNOSIS — E782 Mixed hyperlipidemia: Secondary | ICD-10-CM

## 2017-05-23 DIAGNOSIS — E119 Type 2 diabetes mellitus without complications: Secondary | ICD-10-CM

## 2017-05-23 NOTE — Progress Notes (Signed)
Subjective:    Patient ID: Karen Rios, female    DOB: 06/08/1956, PCP Sinda Du, MD   Past Medical History:  Diagnosis Date  . Arthritis   . Diabetes mellitus without complication ([place])   . Hypertension    Past Surgical History:  Procedure Laterality Date  . TUBAL LIGATION     Social History   Socioeconomic History  . Marital status: Married    Spouse name: None  . Number of children: None  . Years of education: None  . Highest education level: None  Social Needs  . Financial resource strain: None  . Food insecurity - worry: None  . Food insecurity - inability: None  . Transportation needs - medical: None  . Transportation needs - non-medical: None  Occupational History  . None  Tobacco Use  . Smoking status: Never Smoker  . Smokeless tobacco: Never Used  Substance and Sexual Activity  . Alcohol use: No  . Drug use: No  . Sexual activity: None  Other Topics Concern  . None  Social History Narrative  . None   Outpatient Encounter Medications as of 05/23/2017  Medication Sig  . benazepril (LOTENSIN) 40 MG tablet TAKE 1 TABLET BY MOUTH EVERY DAY  . Blood Glucose Monitoring Suppl (ACCU-CHEK AVIVA PLUS) w/Device KIT Test BG 4 x daily. e11.65  . CVS D3 5000 units capsule TAKE 1 CAPSULE (5,000 UNITS TOTAL) BY MOUTH DAILY.  . furosemide (LASIX) 40 MG tablet Take 40 mg by mouth every morning.  Marland Kitchen glucose blood (ACCU-CHEK AVIVA PLUS) test strip USE ONE STRIP 4 TIMES A DAY.E11.65  . hydrochlorothiazide (HYDRODIURIL) 25 MG tablet Take 1 tablet (25 mg total) by mouth daily.  . metFORMIN (GLUCOPHAGE) 500 MG tablet Take 1 tablet (500 mg total) by mouth 2 (two) times daily with a meal.  . Multiple Vitamins-Minerals (CENTRUM SILVER ULTRA WOMENS) TABS Take 1 tablet by mouth every morning.  . simvastatin (ZOCOR) 20 MG tablet Take 1 tablet (20 mg total) by mouth at bedtime.   No facility-administered encounter medications on file as of 05/23/2017.    ALLERGIES: No  Known Allergies VACCINATION STATUS:  There is no immunization history on file for this patient.  Diabetes  She presents for her follow-up diabetic visit. She has type 2 diabetes mellitus. Onset time: She was diagnosed at approximate age of 52 years. Her disease course has been stable (She has improved her A1c from 18.8% to 5.7% !). There are no hypoglycemic associated symptoms. Pertinent negatives for hypoglycemia include no confusion, headaches, pallor or seizures. There are no diabetic associated symptoms. Pertinent negatives for diabetes include no chest pain, no polydipsia, no polyphagia and no polyuria. There are no hypoglycemic complications. Symptoms are stable. There are no diabetic complications. Risk factors for coronary artery disease include diabetes mellitus, dyslipidemia, hypertension, obesity, sedentary lifestyle and post-menopausal. Current diabetic treatment includes oral agent (monotherapy). She is compliant with treatment most of the time. Her weight is decreasing steadily. She is following a diabetic diet. When asked about meal planning, she reported none. She has had a previous visit with a dietitian. She participates in exercise intermittently. An ACE inhibitor/angiotensin II receptor blocker is being taken.  Hyperlipidemia  This is a chronic problem. The current episode started more than 1 year ago. The problem is controlled. Exacerbating diseases include diabetes. Pertinent negatives include no chest pain, focal sensory loss, myalgias or shortness of breath. Current antihyperlipidemic treatment includes statins.  Hypertension  This is a chronic problem.  The current episode started more than 1 year ago. The problem is uncontrolled. Pertinent negatives include no chest pain, headaches, palpitations or shortness of breath. Risk factors for coronary artery disease include diabetes mellitus, obesity, sedentary lifestyle and post-menopausal state. Past treatments include ACE inhibitors  and diuretics. The current treatment provides mild improvement. Compliance problems include medication cost.      Review of Systems  Constitutional: Negative for chills, fever and unexpected weight change.  HENT: Negative for trouble swallowing and voice change.   Eyes: Negative for visual disturbance.  Respiratory: Negative for cough, shortness of breath and wheezing.   Cardiovascular: Negative for chest pain, palpitations and leg swelling.  Gastrointestinal: Negative for diarrhea, nausea and vomiting.  Endocrine: Negative for cold intolerance, heat intolerance, polydipsia, polyphagia and polyuria.  Musculoskeletal: Negative for arthralgias and myalgias.  Skin: Negative for color change, pallor, rash and wound.  Neurological: Negative for seizures and headaches.  Psychiatric/Behavioral: Negative for confusion and suicidal ideas.    Objective:    BP (!) 160/93   Pulse 90   Ht '5\' 9"'  (1.753 m)   Wt 291 lb (132 kg)   BMI 42.97 kg/m   Wt Readings from Last 3 Encounters:  05/23/17 291 lb (132 kg)  11/20/16 288 lb (130.6 kg)  11/20/16 288 lb (130.6 kg)    Physical Exam  Constitutional: She is oriented to person, place, and time. She appears well-developed.  HENT:  Head: Normocephalic and atraumatic.  Eyes: EOM are normal.  Neck: Normal range of motion. Neck supple. No tracheal deviation present. No thyromegaly present.  Cardiovascular: Normal rate and regular rhythm.  Pulmonary/Chest: Effort normal and breath sounds normal.  Abdominal: Soft. Bowel sounds are normal. There is no tenderness. There is no guarding.  Musculoskeletal: Normal range of motion. She exhibits no edema.  Neurological: She is alert and oriented to person, place, and time. She has normal reflexes. No cranial nerve deficit. Coordination normal.  Skin: Skin is warm and dry. No rash noted. No erythema. No pallor.  Psychiatric: She has a normal mood and affect. Judgment normal.    CMP     Component Value  Date/Time   NA 142 05/16/2017 0711   K 3.8 05/16/2017 0711   CL 105 05/16/2017 0711   CO2 26 05/16/2017 0711   GLUCOSE 110 (H) 05/16/2017 0711   BUN 13 05/16/2017 0711   CREATININE 0.66 05/16/2017 0711   CALCIUM 9.1 05/16/2017 0711   PROT 6.9 05/16/2017 0711   ALBUMIN 4.1 11/14/2016 0720   AST 14 05/16/2017 0711   ALT 11 05/16/2017 0711   ALKPHOS 66 11/14/2016 0720   BILITOT 0.6 05/16/2017 0711   GFRNONAA 95 05/16/2017 0711   GFRAA 110 05/16/2017 0711     Diabetic Labs (most recent): Lab Results  Component Value Date   HGBA1C 5.7 (H) 05/16/2017   HGBA1C 5.6 11/14/2016   HGBA1C 5.5 07/10/2016      Assessment & Plan:   1. Uncontrolled type 2 diabetes mellitus without complication, without long-term current use of insulin ([place])  - patient remains at a high risk for more acute and chronic complications of diabetes which include CAD, CVA, CKD, retinopathy, and neuropathy. These are all discussed in detail with the patient.  Patient came with stable A1c of 5.7 %.   Recent labs reviewed.   - I have re-counseled the patient on diet managemweight loss   by adopting a carbohydrate restricted / protein rich  Diet.  -  Suggestion is made for her  to avoid simple carbohydrates  from her diet including Cakes, Sweet Desserts / Pastries, Ice Cream, Soda (diet and regular), Sweet Tea, Candies, Chips, Cookies, Store Bought Juices, Alcohol in Excess of  1-2 drinks a day, Artificial Sweeteners, and "Sugar-free" Products. This will help patient to have stable blood glucose profile and potentially avoid unintended weight gain.   - Patient is advised to stick to a routine mealtimes to eat 3 meals  a day and avoid unnecessary snacks ( to snack only to correct hypoglycemia).   - I have approached patient with the following individualized plan to manage diabetes and patient agrees.  - I advised her to continue low-dose metformin 500 mg by mouth twice a day after breakfast and supper,  therapeutically suitable for patient.  - Patient will be considered for incretin therapy  If she loses control .  - Patient specific target  for A1c; LDL, HDL, Triglycerides, and  Waist Circumference were discussed in detail.  2) BP/HTN:  Uncontrolled. She insists that blood pressure is much better at home or in stores than in doctor's offices. She insists that she has all her 3 medications including been other peer 40 mg by mouth daily, hydrochlorothiazide 25 mg by mouth daily and Lasix 40 mg by mouth every morning. I advised her to take her blood pressure medications in the morning every day.  3) Lipids/HPL:  continue statins. 4)  Weight/Diet: CDE consult in progress, exercise, and carbohydrates information provided.  5) Chronic Care/Health Maintenance:  -Patient is on ACEI/ARB and Statin medications and encouraged to continue to follow up with Ophthalmology, Podiatrist at least yearly or according to recommendations, and advised to  stay away from smoking. I have recommended yearly flu vaccine and pneumonia vaccination at least every 5 years; moderate intensity exercise for up to 150 minutes weekly; and  sleep for at least 7 hours a day.  - I advised patient to maintain close follow up with Sinda Du, MD for primary care needs. - Time spent with the patient: 25 min, of which >50% was spent in reviewing her current and  previous labs  and developing a plan to avoid hypo- and hyper-glycemia.    Follow up plan: -Return in about 4 months (around 09/20/2017) for follow up with pre-visit labs.  Glade Lloyd, MD Phone: [phone]  Fax: [phone]   -  This note was partially dictated with voice recognition software. Similar sounding words can be transcribed inadequately or may not  be corrected upon review.  05/23/2017, 9:56 AM

## 2017-05-23 NOTE — Patient Instructions (Signed)

## 2017-05-28 ENCOUNTER — Encounter: Payer: Self-pay | Admitting: Nutrition

## 2017-05-28 ENCOUNTER — Encounter: Payer: Medicare Other | Attending: "Endocrinology | Admitting: Nutrition

## 2017-05-28 DIAGNOSIS — Z713 Dietary counseling and surveillance: Secondary | ICD-10-CM | POA: Insufficient documentation

## 2017-05-28 DIAGNOSIS — E1165 Type 2 diabetes mellitus with hyperglycemia: Secondary | ICD-10-CM | POA: Insufficient documentation

## 2017-05-28 DIAGNOSIS — E118 Type 2 diabetes mellitus with unspecified complications: Secondary | ICD-10-CM | POA: Insufficient documentation

## 2017-05-28 NOTE — Progress Notes (Signed)
  Medical Nutrition Therapy:  Appt start time: 1015 end time:  1030 Assessment:  Assessment:  Primary concerns today: Diabetes Type 2. Lost 3 lbs. Has cut out coffee and lost 3 lbs. Metformin 500 mg a day.  . A1C 5.7%. FBS 100's in am. Feels great. Eating well balanced meals.  Going [facility] [employer] daily and does water aerobics and use equipment. Working on weight loss. Making excellent progress Denies any low blood sugars. BS in am 90-100's. Lab Results  Component Value Date   HGBA1C 5.7 (H) 05/16/2017    Preferred Learning Style:     No preference indicated   Learning Readiness:   Ready  Change in progress  MEDICATIONS: see list   DIETARY INTAKE:  24-hr recall:  B ( AM): Oatmeal boiled egg and fruit, water  Snk ( AM): none L ( PM):same as dinner. D ( PM):Broccoli salad, string beans, chicken, pintos, Snk ( PM): water Beverages: water  Usual physical activity:  [facility]ob[facility] 4 times per week and walking in the pool  Estimated energy needs: 1800 calories 200 g carbohydrates 135 g protein 50 g fat  Progress Towards Goal(s):  In progress.   Nutritional Diagnosis:  NB-1.1 Food and nutrition-related knowledge deficit As related to Diabetes.  As evidenced by A1C that was 18% and now down to 5.5% .    Intervention:  Nutrition counseling and diabetes education on meal planning with My Plate, carb counting, portion sizes, weight loss tips, benefits of exercise and prevention of complications from DM.  Weight loss tips and increasing physical activity. Avoiding salty processed foods.   Goals 1. Lose 2-3 lbs per month. Keep eating three meals a day Continue to increase fiber rich foods of fresh fruits and vegetables. Cut out coffee. Teaching Method Utilized:Visual Auditory Hands on  Handouts given during visit include: The Plate Method Meal Plan Card  Barriers to learning/adherence to lifestyle change: none  Demonstrated degree of understanding via:   Teach Back   Monitoring/Evaluation:  Dietary intake, exercise, meal planning, and body weight in 6 month(s) to continue to work on weight loss. .Marland Kitchen

## 2017-05-28 NOTE — Patient Instructions (Addendum)
Goals 1. Lose 2-3 lbs per month. Keep eating three meals a day Continue to increase fiber rich foods of fresh fruits and vegetables. Cut out coffee.

## 2017-06-03 DIAGNOSIS — Z0000 Encounter for general adult medical examination without abnormal findings: Secondary | ICD-10-CM | POA: Diagnosis not present

## 2017-06-10 DIAGNOSIS — I10 Essential (primary) hypertension: Secondary | ICD-10-CM | POA: Diagnosis not present

## 2017-06-10 DIAGNOSIS — E785 Hyperlipidemia, unspecified: Secondary | ICD-10-CM | POA: Diagnosis not present

## 2017-06-10 DIAGNOSIS — M545 Low back pain: Secondary | ICD-10-CM | POA: Diagnosis not present

## 2017-06-10 DIAGNOSIS — Z0000 Encounter for general adult medical examination without abnormal findings: Secondary | ICD-10-CM | POA: Diagnosis not present

## 2017-06-12 DIAGNOSIS — Z1211 Encounter for screening for malignant neoplasm of colon: Secondary | ICD-10-CM | POA: Diagnosis not present

## 2017-07-22 DIAGNOSIS — Z0100 Encounter for examination of eyes and vision without abnormal findings: Secondary | ICD-10-CM | POA: Diagnosis not present

## 2017-07-22 DIAGNOSIS — E109 Type 1 diabetes mellitus without complications: Secondary | ICD-10-CM | POA: Diagnosis not present

## 2017-08-12 ENCOUNTER — Telehealth: Payer: Self-pay | Admitting: "Endocrinology

## 2017-08-12 NOTE — Telephone Encounter (Signed)
Gavin PoundDeborah is asking for her metFORMIN (GLUCOPHAGE) 500 MG tablet called to [facility]

## 2017-08-13 ENCOUNTER — Other Ambulatory Visit: Payer: Self-pay

## 2017-08-13 MED ORDER — METFORMIN HCL 500 MG PO TABS: 500.0000 mg | ORAL_TABLET | Freq: Two times a day (BID) | ORAL | 0 refills | Status: DC

## 2017-08-13 NOTE — Telephone Encounter (Signed)
Pt.notified

## 2017-08-27 ENCOUNTER — Other Ambulatory Visit: Payer: Self-pay | Admitting: "Endocrinology

## 2017-09-16 DIAGNOSIS — E119 Type 2 diabetes mellitus without complications: Secondary | ICD-10-CM | POA: Diagnosis not present

## 2017-09-17 LAB — COMPLETE METABOLIC PANEL WITH GFR
AG Ratio: 1.7 (calc) (ref 1.0–2.5)
ALKALINE PHOSPHATASE (APISO): 74 U/L (ref 33–130)
ALT: 13 U/L (ref 6–29)
AST: 17 U/L (ref 10–35)
Albumin: 4.3 g/dL (ref 3.6–5.1)
BUN: 17 mg/dL (ref 7–25)
CO2: 27 mmol/L (ref 20–32)
CREATININE: 0.74 mg/dL (ref 0.50–0.99)
Calcium: 9.6 mg/dL (ref 8.6–10.4)
Chloride: 105 mmol/L (ref 98–110)
GFR, Est African American: 101 mL/min/{1.73_m2} (ref >=60)
GFR, Est Non African American: 87 mL/min/{1.73_m2} (ref >=60)
GLUCOSE: 114 mg/dL — AB (ref 65–99)
Globulin: 2.6 g/dL (calc) (ref 1.9–3.7)
Potassium: 3.9 mmol/L (ref 3.5–5.3)
SODIUM: 140 mmol/L (ref 135–146)
Total Bilirubin: 0.6 mg/dL (ref 0.2–1.2)
Total Protein: 6.9 g/dL (ref 6.1–8.1)

## 2017-09-17 LAB — HEMOGLOBIN A1C
EAG (MMOL/L): 6.6 (calc)
Hgb A1c MFr Bld: 5.8 % of total Hgb — ABNORMAL HIGH (ref <5.7)
MEAN PLASMA GLUCOSE: 120 (calc)

## 2017-09-23 ENCOUNTER — Encounter: Payer: Medicare Other | Attending: Pulmonary Disease | Admitting: Nutrition

## 2017-09-23 ENCOUNTER — Encounter: Payer: Self-pay | Admitting: "Endocrinology

## 2017-09-23 ENCOUNTER — Encounter: Payer: Self-pay | Admitting: Nutrition

## 2017-09-23 ENCOUNTER — Ambulatory Visit: Payer: Medicare Other | Admitting: "Endocrinology

## 2017-09-23 VITALS — BP 154/91 | HR 92 | Ht 69.0 in | Wt 284.0 lb

## 2017-09-23 DIAGNOSIS — I10 Essential (primary) hypertension: Secondary | ICD-10-CM | POA: Diagnosis not present

## 2017-09-23 DIAGNOSIS — Z713 Dietary counseling and surveillance: Secondary | ICD-10-CM | POA: Insufficient documentation

## 2017-09-23 DIAGNOSIS — E119 Type 2 diabetes mellitus without complications: Secondary | ICD-10-CM

## 2017-09-23 DIAGNOSIS — E782 Mixed hyperlipidemia: Secondary | ICD-10-CM

## 2017-09-23 DIAGNOSIS — E118 Type 2 diabetes mellitus with unspecified complications: Secondary | ICD-10-CM | POA: Insufficient documentation

## 2017-09-23 DIAGNOSIS — Z6841 Body Mass Index (BMI) 40.0 and over, adult: Secondary | ICD-10-CM | POA: Insufficient documentation

## 2017-09-23 MED ORDER — LISINOPRIL 10 MG PO TABS: 10.0000 mg | ORAL_TABLET | Freq: Every day | ORAL | 3 refills | Status: DC

## 2017-09-23 NOTE — Patient Instructions (Signed)
Goals 1 Lose 6-10 lbs in the next 6 months 2. Keep up the great job! Keep eating fresh fruits and vegetables. 

## 2017-09-23 NOTE — Progress Notes (Signed)
Subjective:    Patient ID: Karen Rios, female    DOB: 1956/03/03, PCP Sinda Du, MD   Past Medical History:  Diagnosis Date  . Arthritis   . Diabetes mellitus without complication ([place])   . Hypertension    Past Surgical History:  Procedure Laterality Date  . TUBAL LIGATION     Social History   Socioeconomic History  . Marital status: Married    Spouse name: None  . Number of children: None  . Years of education: None  . Highest education level: None  Social Needs  . Financial resource strain: None  . Food insecurity - worry: None  . Food insecurity - inability: None  . Transportation needs - medical: None  . Transportation needs - non-medical: None  Occupational History  . None  Tobacco Use  . Smoking status: Never Smoker  . Smokeless tobacco: Never Used  Substance and Sexual Activity  . Alcohol use: No  . Drug use: No  . Sexual activity: None  Other Topics Concern  . None  Social History Narrative  . None   Outpatient Encounter Medications as of 09/23/2017  Medication Sig  . benazepril (LOTENSIN) 40 MG tablet TAKE 1 TABLET BY MOUTH EVERY DAY  . Blood Glucose Monitoring Suppl (ACCU-CHEK AVIVA PLUS) w/Device KIT Test BG 4 x daily. e11.65  . [employer] D3 5000 units capsule TAKE 1 CAPSULE (5,000 UNITS TOTAL) BY MOUTH DAILY.  . furosemide (LASIX) 40 MG tablet Take 40 mg by mouth every morning.  Marland Kitchen glucose blood (ACCU-CHEK AVIVA PLUS) test strip USE ONE STRIP 4 TIMES A DAY.E11.65  . hydrochlorothiazide (HYDRODIURIL) 25 MG tablet Take 1 tablet (25 mg total) by mouth daily.  Marland Kitchen lisinopril (PRINIVIL,ZESTRIL) 10 MG tablet Take 1 tablet (10 mg total) by mouth daily.  . metFORMIN (GLUCOPHAGE) 500 MG tablet Take 1 tablet (500 mg total) by mouth 2 (two) times daily with a meal.  . Multiple Vitamins-Minerals (CENTRUM SILVER ULTRA WOMENS) TABS Take 1 tablet by mouth every morning.  . simvastatin (ZOCOR) 20 MG tablet TAKE 1 TABLET BY MOUTH AT BEDTIME.  . [DISCONTINUED]  hydrochlorothiazide (HYDRODIURIL) 25 MG tablet TAKE 1 TABLET BY MOUTH ONCE DAILY.   No facility-administered encounter medications on file as of 09/23/2017.    ALLERGIES: No Known Allergies VACCINATION STATUS:  There is no immunization history on file for this patient.  Diabetes  She presents for her follow-up diabetic visit. She has type 2 diabetes mellitus. Onset time: She was diagnosed at approximate age of 1 years. Her disease course has been stable (She has improved her A1c from 18.8% to 5.7% !). There are no hypoglycemic associated symptoms. Pertinent negatives for hypoglycemia include no confusion, headaches, pallor or seizures. There are no diabetic associated symptoms. Pertinent negatives for diabetes include no chest pain, no polydipsia, no polyphagia and no polyuria. There are no hypoglycemic complications. Symptoms are stable. There are no diabetic complications. Risk factors for coronary artery disease include diabetes mellitus, dyslipidemia, hypertension, obesity, sedentary lifestyle and post-menopausal. Current diabetic treatment includes oral agent (monotherapy). She is compliant with treatment most of the time. Her weight is decreasing steadily. She is following a diabetic diet. When asked about meal planning, she reported none. She has had a previous visit with a dietitian. She participates in exercise intermittently. An ACE inhibitor/angiotensin II receptor blocker is being taken.  Hyperlipidemia  This is a chronic problem. The current episode started more than 1 year ago. The problem is controlled. Exacerbating diseases include  diabetes. Pertinent negatives include no chest pain, focal sensory loss, myalgias or shortness of breath. Current antihyperlipidemic treatment includes statins.  Hypertension  This is a chronic problem. The current episode started more than 1 year ago. The problem is uncontrolled. Pertinent negatives include no chest pain, headaches, palpitations or  shortness of breath. Risk factors for coronary artery disease include diabetes mellitus, obesity, sedentary lifestyle and post-menopausal state. Past treatments include ACE inhibitors and diuretics. The current treatment provides mild improvement. Compliance problems include medication cost.      Review of Systems  Constitutional: Negative for chills, fever and unexpected weight change.  HENT: Negative for trouble swallowing and voice change.   Eyes: Negative for visual disturbance.  Respiratory: Negative for cough, shortness of breath and wheezing.   Cardiovascular: Negative for chest pain, palpitations and leg swelling.  Gastrointestinal: Negative for diarrhea, nausea and vomiting.  Endocrine: Negative for cold intolerance, heat intolerance, polydipsia, polyphagia and polyuria.  Musculoskeletal: Negative for arthralgias and myalgias.  Skin: Negative for color change, pallor, rash and wound.  Neurological: Negative for seizures and headaches.  Psychiatric/Behavioral: Negative for confusion and suicidal ideas.    Objective:    BP (!) 154/91   Pulse 92   Ht '5\' 9"'$  (1.753 m)   Wt 284 lb (128.8 kg)   BMI 41.94 kg/m   Wt Readings from Last 3 Encounters:  09/23/17 284 lb (128.8 kg)  09/23/17 284 lb (128.8 kg)  05/28/17 288 lb (130.6 kg)    Physical Exam  Constitutional: She is oriented to person, place, and time. She appears well-developed.  HENT:  Head: Normocephalic and atraumatic.  Eyes: EOM are normal.  Neck: Normal range of motion. Neck supple. No tracheal deviation present. No thyromegaly present.  Cardiovascular: Normal rate and regular rhythm.  Pulmonary/Chest: Effort normal and breath sounds normal.  Abdominal: Soft. Bowel sounds are normal. There is no tenderness. There is no guarding.  Musculoskeletal: Normal range of motion. She exhibits no edema.  Neurological: She is alert and oriented to person, place, and time. She has normal reflexes. No cranial nerve deficit.  Coordination normal.  Skin: Skin is warm and dry. No rash noted. No erythema. No pallor.  Psychiatric: She has a normal mood and affect. Judgment normal.    CMP     Component Value Date/Time   NA 140 09/16/2017 0712   K 3.9 09/16/2017 0712   CL 105 09/16/2017 0712   CO2 27 09/16/2017 0712   GLUCOSE 114 (H) 09/16/2017 0712   BUN 17 09/16/2017 0712   CREATININE 0.74 09/16/2017 0712   CALCIUM 9.6 09/16/2017 0712   PROT 6.9 09/16/2017 0712   ALBUMIN 4.1 11/14/2016 0720   AST 17 09/16/2017 0712   ALT 13 09/16/2017 0712   ALKPHOS 66 11/14/2016 0720   BILITOT 0.6 09/16/2017 0712   GFRNONAA 87 09/16/2017 0712   GFRAA 101 09/16/2017 0712     Diabetic Labs (most recent): Lab Results  Component Value Date   HGBA1C 5.8 (H) 09/16/2017   HGBA1C 5.7 (H) 05/16/2017   HGBA1C 5.6 11/14/2016      Assessment & Plan:   1. Uncontrolled type 2 diabetes mellitus without complication, without long-term current use of insulin ([place])  - patient remains at a high risk for more acute and chronic complications of diabetes which include CAD, CVA, CKD, retinopathy, and neuropathy. These are all discussed in detail with the patient.  Patient came with stable A1c of 5.8 %.   Recent labs reviewed.   -  I have re-counseled the patient on diet managemweight loss   by adopting a carbohydrate restricted / protein rich  Diet.  -  Suggestion is made for her to avoid simple carbohydrates  from her diet including Cakes, Sweet Desserts / Pastries, Ice Cream, Soda (diet and regular), Sweet Tea, Candies, Chips, Cookies, Store Bought Juices, Alcohol in Excess of  1-2 drinks a day, Artificial Sweeteners, and "Sugar-free" Products. This will help patient to have stable blood glucose profile and potentially avoid unintended weight gain.  - Patient is advised to stick to a routine mealtimes to eat 3 meals  a day and avoid unnecessary snacks ( to snack only to correct hypoglycemia).   - I have approached patient with  the following individualized plan to manage diabetes and patient agrees.  - I advised her to continue low-dose metformin 500 mg by mouth twice a day after breakfast and supper, therapeutically suitable for patient.  - Patient will be considered for incretin therapy  If she loses control .  - Patient specific target  for A1c; LDL, HDL, Triglycerides, and  Waist Circumference were discussed in detail.  2) BP/HTN: Her blood pressure is not controlled to target.  I have added lisinopril 10 mg p.o. daily along with her hydrochlorothiazide 25 mg p.o. daily.  She is also on Lasix on and off.  I advised her to take her blood pressure medications in the morning every day.  3) Lipids/HPL: Her recent lipid panel showed LDL of 80.  She is advised to continue statins. 4)  Weight/Diet: CDE consult in progress, exercise, and carbohydrates information provided.  5) Chronic Care/Health Maintenance:  -Patient is on ACEI/ARB and Statin medications and encouraged to continue to follow up with Ophthalmology, Podiatrist at least yearly or according to recommendations, and advised to  stay away from smoking. I have recommended yearly flu vaccine and pneumonia vaccination at least every 5 years; moderate intensity exercise for up to 150 minutes weekly; and  sleep for at least 7 hours a day.  - I advised patient to maintain close follow up with Sinda Du, MD for primary care needs.  - Time spent with the patient: 25 min, of which >50% was spent in reviewing her  current and  previous labs, previous treatments, and medications  doses and developing a plan for long-term care.     Follow up plan: -Return in about 6 months (around 03/26/2018) for follow up with pre-visit labs.  Glade Lloyd, MD Phone: [phone]  Fax: [phone]   -  This note was partially dictated with voice recognition software. Similar sounding words can be transcribed inadequately or may not  be corrected upon review.  09/23/2017, 2:00  PM

## 2017-09-23 NOTE — Patient Instructions (Signed)

## 2017-09-23 NOTE — Progress Notes (Signed)
   Medical Nutrition Therapy:  Appt start time: 1030 end time:  1100 Assessment:  Assessment:  Primary concerns today: Diabetes Type 2. .  and lost 4 lbs.  Saw Dr. Fransico HimNida, Endocrinology today. Metformin 500 mg a day.  . A1C 5.8%. FBS 100's in am. Feels great. Eating well balanced meals.  [facility]  and does water aerobics and use equipment. Working on weight loss. Cut out coffee and no junk food.  Making excellent progress Denies any low blood sugars. BS in am 90-100's.  Lab Results  Component Value Date   HGBA1C 5.8 (H) 09/16/2017    Preferred Learning Style:     No preference indicated   Learning Readiness:   Ready  Change in progress  MEDICATIONS: see list   DIETARY INTAKE:  24-hr recall:  B ( AM): Oatmeal boiled egg and fruit, water  Snk ( AM): none L ( PM):Broccoli, cottage cheese, chicken breast, water or skim milk. D ( PM): Toss salad and chicken, and fruit, water    Usual physical activity:  [facility] week and walking in the pool  Estimated energy needs: 1800 calories 200 g carbohydrates 135 g protein 50 g fat  Progress Towards Goal(s):  In progress.   Nutritional Diagnosis:  NB-1.1 Food and nutrition-related knowledge deficit As related to Diabetes.  As evidenced by A1C that was 18% and now down to 5.5% .    Intervention:  Nutrition counseling and diabetes education on meal planning with My Plate, carb counting, portion sizes, weight loss tips, benefits of exercise and prevention of complications from DM.  Weight loss tips and increasing physical activity. Avoiding salty processed foods.  Goals 1 Lose 6-10 lbs in the next 6 months 2. Keep up the great job! Keep eating fresh fruits and vegetables.   Teaching Method Utilized:Visual Auditory Hands on  Handouts given during visit include: The Plate Method Meal Plan Card  Barriers to learning/adherence to lifestyle change: none  Demonstrated  degree of understanding via:  Teach Back   Monitoring/Evaluation:  Dietary intake, exercise, meal planning, and body weight in 6 month(s) to continue to work on weight loss. .Marland Kitchen

## 2017-10-15 ENCOUNTER — Other Ambulatory Visit: Payer: Self-pay | Admitting: "Endocrinology

## 2017-11-13 ENCOUNTER — Other Ambulatory Visit: Payer: Self-pay | Admitting: "Endocrinology

## 2017-11-20 DIAGNOSIS — L818 Other specified disorders of pigmentation: Secondary | ICD-10-CM | POA: Diagnosis not present

## 2017-12-02 DIAGNOSIS — E119 Type 2 diabetes mellitus without complications: Secondary | ICD-10-CM | POA: Diagnosis not present

## 2017-12-02 DIAGNOSIS — I10 Essential (primary) hypertension: Secondary | ICD-10-CM | POA: Diagnosis not present

## 2017-12-02 DIAGNOSIS — M545 Low back pain: Secondary | ICD-10-CM | POA: Diagnosis not present

## 2017-12-15 ENCOUNTER — Other Ambulatory Visit: Payer: Self-pay | Admitting: "Endocrinology

## 2018-01-16 ENCOUNTER — Other Ambulatory Visit: Payer: Self-pay | Admitting: "Endocrinology

## 2018-02-13 ENCOUNTER — Other Ambulatory Visit: Payer: Self-pay | Admitting: "Endocrinology

## 2018-02-19 ENCOUNTER — Other Ambulatory Visit: Payer: Self-pay | Admitting: "Endocrinology

## 2018-03-17 DIAGNOSIS — E782 Mixed hyperlipidemia: Secondary | ICD-10-CM | POA: Diagnosis not present

## 2018-03-17 DIAGNOSIS — E559 Vitamin D deficiency, unspecified: Secondary | ICD-10-CM | POA: Diagnosis not present

## 2018-03-17 DIAGNOSIS — E119 Type 2 diabetes mellitus without complications: Secondary | ICD-10-CM | POA: Diagnosis not present

## 2018-03-18 ENCOUNTER — Other Ambulatory Visit: Payer: Self-pay | Admitting: "Endocrinology

## 2018-03-18 LAB — COMPLETE METABOLIC PANEL WITH GFR
AG Ratio: 1.6 (calc) (ref 1.0–2.5)
ALBUMIN MSPROF: 4.4 g/dL (ref 3.6–5.1)
ALT: 14 U/L (ref 6–29)
AST: 15 U/L (ref 10–35)
Alkaline phosphatase (APISO): 72 U/L (ref 33–130)
BUN: 12 mg/dL (ref 7–25)
CALCIUM: 9.7 mg/dL (ref 8.6–10.4)
CO2: 29 mmol/L (ref 20–32)
CREATININE: 0.84 mg/dL (ref 0.50–0.99)
Chloride: 104 mmol/L (ref 98–110)
GFR, EST AFRICAN AMERICAN: 86 mL/min/{1.73_m2} (ref >=60)
GFR, EST NON AFRICAN AMERICAN: 74 mL/min/{1.73_m2} (ref >=60)
GLOBULIN: 2.7 g/dL (ref 1.9–3.7)
GLUCOSE: 115 mg/dL — AB (ref 65–99)
Potassium: 4 mmol/L (ref 3.5–5.3)
SODIUM: 141 mmol/L (ref 135–146)
TOTAL PROTEIN: 7.1 g/dL (ref 6.1–8.1)
Total Bilirubin: 0.4 mg/dL (ref 0.2–1.2)

## 2018-03-18 LAB — HEMOGLOBIN A1C
EAG (MMOL/L): 6.6 (calc)
Hgb A1c MFr Bld: 5.8 % of total Hgb — ABNORMAL HIGH (ref <5.7)
Mean Plasma Glucose: 120 (calc)

## 2018-03-18 LAB — VITAMIN D 25 HYDROXY (VIT D DEFICIENCY, FRACTURES): VIT D 25 HYDROXY: 80 ng/mL (ref 30–100)

## 2018-03-18 LAB — LIPID PANEL
CHOL/HDL RATIO: 3.1 (calc) (ref <5.0)
CHOLESTEROL: 147 mg/dL (ref <200)
HDL: 47 mg/dL — ABNORMAL LOW (ref >50)
LDL CHOLESTEROL (CALC): 75 mg/dL
NON-HDL CHOLESTEROL (CALC): 100 mg/dL (ref <130)
Triglycerides: 153 mg/dL — ABNORMAL HIGH (ref <150)

## 2018-03-26 ENCOUNTER — Ambulatory Visit (INDEPENDENT_AMBULATORY_CARE_PROVIDER_SITE_OTHER): Payer: Medicare Other | Admitting: "Endocrinology

## 2018-03-26 ENCOUNTER — Encounter: Payer: Medicare Other | Attending: Pulmonary Disease | Admitting: Nutrition

## 2018-03-26 ENCOUNTER — Encounter: Payer: Self-pay | Admitting: "Endocrinology

## 2018-03-26 VITALS — BP 160/84 | HR 92 | Ht 69.0 in | Wt 287.0 lb

## 2018-03-26 DIAGNOSIS — E118 Type 2 diabetes mellitus with unspecified complications: Secondary | ICD-10-CM | POA: Diagnosis not present

## 2018-03-26 DIAGNOSIS — Z713 Dietary counseling and surveillance: Secondary | ICD-10-CM | POA: Diagnosis not present

## 2018-03-26 DIAGNOSIS — E782 Mixed hyperlipidemia: Secondary | ICD-10-CM | POA: Diagnosis not present

## 2018-03-26 DIAGNOSIS — I10 Essential (primary) hypertension: Secondary | ICD-10-CM | POA: Diagnosis not present

## 2018-03-26 DIAGNOSIS — E119 Type 2 diabetes mellitus without complications: Secondary | ICD-10-CM

## 2018-03-26 DIAGNOSIS — Z7984 Long term (current) use of oral hypoglycemic drugs: Secondary | ICD-10-CM | POA: Diagnosis not present

## 2018-03-26 DIAGNOSIS — Z6841 Body Mass Index (BMI) 40.0 and over, adult: Secondary | ICD-10-CM

## 2018-03-26 MED ORDER — CLONIDINE HCL 0.1 MG PO TABS: 0.1000 mg | ORAL_TABLET | Freq: Two times a day (BID) | ORAL | 6 refills | Status: DC

## 2018-03-26 MED ORDER — METFORMIN HCL 500 MG PO TABS: ORAL_TABLET | ORAL | 0 refills | Status: DC

## 2018-03-26 MED ORDER — HYDROCHLOROTHIAZIDE 25 MG PO TABS: 25.0000 mg | ORAL_TABLET | Freq: Every day | ORAL | 1 refills | Status: DC

## 2018-03-26 NOTE — Patient Instructions (Signed)

## 2018-03-26 NOTE — Progress Notes (Signed)
   Medical Nutrition Therapy:  Appt start time: 1000  end time:  1030 Assessment:  Assessment:  Primary concerns today: Diabetes Type 2. .  and lost 4 lbs.  Saw Dr. Fransico HimNida, Endocrinology today. Metformin 500 mg a day.  . A1C 5.8%. FBS 100's in am. Feels great. Eating well balanced meals.  [facility]  and does water aerobics and uses equipment. Working on weight loss. Cut out coffee and no junk food.  Making excellent progress Denies any low blood sugars. BS in am 90-100's.  Lab Results  Component Value Date   HGBA1C 5.8 (H) 03/17/2018   Vitals with BMI 03/26/2018  Height 5\' 9"   Weight 287 lbs  BMI 42.36  Systolic 166  Diastolic 94  Pulse 92  Respirations     Preferred Learning Style:     No preference indicated   Learning Readiness:   Ready  Change in progress  MEDICATIONS: see list   DIETARY INTAKE:  24-hr recall:  B ( AM): Oatmeal boiled egg and fruit, water  Snk ( AM): none L ( PM):Broccoli, cottage cheese, chicken breast, water or skim milk. D ( PM): Toss salad and chicken, and fruit, water    Usual physical activity:  [facility] per week and walking in the pool  Estimated energy needs: 1800 calories 200 g carbohydrates 135 g protein 50 g fat  Progress Towards Goal(s):  In progress.   Nutritional Diagnosis:  NB-1.1 Food and nutrition-related knowledge deficit As related to Diabetes.  As evidenced by A1C that was 18% and now down to 5.5% .    Intervention:  Nutrition counseling and diabetes education on meal planning with My Plate, carb counting, portion sizes, weight loss tips, benefits of exercise and prevention of complications from DM.  Weight loss tips and increasing physical activity. Avoiding salty processed foods.  Goals 1 Lose 6-10 lbs in the next 6 months 2. Keep up the great job! Keep eating fresh fruits and vegetables.   Teaching Method Utilized:Visual Auditory Hands on  Handouts given  during visit include: The Plate Method Meal Plan Card  Barriers to learning/adherence to lifestyle change: none  Demonstrated degree of understanding via:  Teach Back   Monitoring/Evaluation:  Dietary intake, exercise, meal planning, and body weight in 6 month(s) to continue to work on weight loss. .Marland Kitchen

## 2018-03-26 NOTE — Progress Notes (Signed)
Endocrinology follow-up note   Subjective:    Patient ID: Karen Rios, female    DOB: Apr 02, 1956, PCP Sinda Du, MD   Past Medical History:  Diagnosis Date  . Arthritis   . Diabetes mellitus without complication ([place])   . Hypertension    Past Surgical History:  Procedure Laterality Date  . TUBAL LIGATION     Social History   Socioeconomic History  . Marital status: Married    Spouse name: Not on file  . Number of children: Not on file  . Years of education: Not on file  . Highest education level: Not on file  Occupational History  . Not on file  Social Needs  . Financial resource strain: Not on file  . Food insecurity:    Worry: Not on file    Inability: Not on file  . Transportation needs:    Medical: Not on file    Non-medical: Not on file  Tobacco Use  . Smoking status: Never Smoker  . Smokeless tobacco: Never Used  Substance and Sexual Activity  . Alcohol use: No  . Drug use: No  . Sexual activity: Not on file  Lifestyle  . Physical activity:    Days per week: Not on file    Minutes per session: Not on file  . Stress: Not on file  Relationships  . Social connections:    Talks on phone: Not on file    Gets together: Not on file    Attends religious service: Not on file    Active member of club or organization: Not on file    Attends meetings of clubs or organizations: Not on file    Relationship status: Not on file  Other Topics Concern  . Not on file  Social History Narrative  . Not on file   Outpatient Encounter Medications as of 03/26/2018  Medication Sig  . ACCU-CHEK AVIVA PLUS test strip USE TO TEST 4 TIMES DAILY.  . benazepril (LOTENSIN) 40 MG tablet TAKE 1 TABLET BY MOUTH EVERY DAY  . Blood Glucose Monitoring Suppl (ACCU-CHEK AVIVA PLUS) w/Device KIT Test BG 4 x daily. [id number]  . cloNIDine (CATAPRES) 0.1 MG tablet Take 1 tablet (0.1 mg total) by mouth 2 (two) times daily.  . hydrochlorothiazide (HYDRODIURIL) 25 MG tablet Take 1  tablet (25 mg total) by mouth daily.  . metFORMIN (GLUCOPHAGE) 500 MG tablet TAKE (1) TABLET BY MOUTH TWICE DAILY WITH A MEAL.  . Multiple Vitamins-Minerals (CENTRUM SILVER ULTRA WOMENS) TABS Take 1 tablet by mouth every morning.  . simvastatin (ZOCOR) 20 MG tablet TAKE 1 TABLET BY MOUTH AT ONCE DAILY.  . [DISCONTINUED] hydrochlorothiazide (HYDRODIURIL) 25 MG tablet TAKE 1 TABLET BY MOUTH ONCE DAILY.  . [DISCONTINUED] metFORMIN (GLUCOPHAGE) 500 MG tablet TAKE (1) TABLET BY MOUTH TWICE DAILY WITH A MEAL.   No facility-administered encounter medications on file as of 03/26/2018.    ALLERGIES: No Known Allergies VACCINATION STATUS:  There is no immunization history on file for this patient.  Diabetes  She presents for her follow-up diabetic visit. She has type 2 diabetes mellitus. Onset time: She was diagnosed at approximate age of 15 years. Her disease course has been stable (She has improved her A1c from 18.8% to 5.7% !). There are no hypoglycemic associated symptoms. Pertinent negatives for hypoglycemia include no confusion, headaches, pallor or seizures. There are no diabetic associated symptoms. Pertinent negatives for diabetes include no chest pain, no polydipsia, no polyphagia and no polyuria. There are no  hypoglycemic complications. Symptoms are stable. There are no diabetic complications. Risk factors for coronary artery disease include diabetes mellitus, dyslipidemia, hypertension, obesity, sedentary lifestyle and post-menopausal. Current diabetic treatment includes oral agent (monotherapy). She is compliant with treatment most of the time. Her weight is decreasing steadily. She is following a diabetic diet. When asked about meal planning, she reported none. She has had a previous visit with a dietitian. She participates in exercise intermittently. An ACE inhibitor/angiotensin II receptor blocker is being taken.  Hyperlipidemia  This is a chronic problem. The current episode started more than  1 year ago. The problem is controlled. Exacerbating diseases include diabetes. Pertinent negatives include no chest pain, focal sensory loss, myalgias or shortness of breath. Current antihyperlipidemic treatment includes statins.  Hypertension  This is a chronic problem. The current episode started more than 1 year ago. The problem is uncontrolled. Pertinent negatives include no chest pain, headaches, palpitations or shortness of breath. Risk factors for coronary artery disease include diabetes mellitus, obesity, sedentary lifestyle and post-menopausal state. Past treatments include ACE inhibitors and diuretics. The current treatment provides mild improvement. Compliance problems include medication cost.      Review of Systems  Constitutional: Negative for chills, fever and unexpected weight change.  HENT: Negative for trouble swallowing and voice change.   Eyes: Negative for visual disturbance.  Respiratory: Negative for cough, shortness of breath and wheezing.   Cardiovascular: Negative for chest pain, palpitations and leg swelling.  Gastrointestinal: Negative for diarrhea, nausea and vomiting.  Endocrine: Negative for cold intolerance, heat intolerance, polydipsia, polyphagia and polyuria.  Musculoskeletal: Negative for arthralgias and myalgias.  Skin: Negative for color change, pallor, rash and wound.  Neurological: Negative for seizures and headaches.  Psychiatric/Behavioral: Negative for confusion and suicidal ideas.    Objective:    BP (!) 160/84 (BP Location: Right Arm, Patient Position: Sitting, Cuff Size: Large)   Pulse 92   Ht '5\' 9"'  (1.753 m)   Wt 287 lb (130.2 kg)   BMI 42.38 kg/m   Wt Readings from Last 3 Encounters:  03/26/18 287 lb (130.2 kg)  09/23/17 284 lb (128.8 kg)  09/23/17 284 lb (128.8 kg)    Physical Exam  Constitutional: She is oriented to person, place, and time. She appears well-developed.  Poor general personal hygiene.  HENT:  Head: Normocephalic and  atraumatic.  Eyes: EOM are normal.  Neck: Normal range of motion. Neck supple. No tracheal deviation present. No thyromegaly present.  Cardiovascular: Normal rate and regular rhythm.  Pulmonary/Chest: Effort normal.  Abdominal: Bowel sounds are normal. There is no tenderness. There is no guarding.  Musculoskeletal: Normal range of motion. She exhibits no edema.  Neurological: She is alert and oriented to person, place, and time. No cranial nerve deficit. Coordination normal.  Skin: Skin is warm and dry. No rash noted. No erythema. No pallor.  Psychiatric: She has a normal mood and affect. Judgment normal.    CMP     Component Value Date/Time   NA 141 03/17/2018 0732   K 4.0 03/17/2018 0732   CL 104 03/17/2018 0732   CO2 29 03/17/2018 0732   GLUCOSE 115 (H) 03/17/2018 0732   BUN 12 03/17/2018 0732   CREATININE 0.84 03/17/2018 0732   CALCIUM 9.7 03/17/2018 0732   PROT 7.1 03/17/2018 0732   ALBUMIN 4.1 11/14/2016 0720   AST 15 03/17/2018 0732   ALT 14 03/17/2018 0732   ALKPHOS 66 11/14/2016 0720   BILITOT 0.4 03/17/2018 0732   GFRNONAA 74  03/17/2018 0732   GFRAA 86 03/17/2018 0732     Diabetic Labs (most recent): Lab Results  Component Value Date   HGBA1C 5.8 (H) 03/17/2018   HGBA1C 5.8 (H) 09/16/2017   HGBA1C 5.7 (H) 05/16/2017      Assessment & Plan:   1. Uncontrolled type 2 diabetes mellitus without complication, without long-term current use of insulin ([place])  -She  remains at a high risk for more acute and chronic complications of diabetes which include CAD, CVA, CKD, retinopathy, and neuropathy. These are all discussed in detail with the patient.  -She returns with stable A1c of 5.8%.   -  Recent labs reviewed.  - I have re-counseled the patient on diet managemweight loss   by adopting a carbohydrate restricted / protein rich  Diet.  -  Suggestion is made for her to avoid simple carbohydrates  from her diet including Cakes, Sweet Desserts / Pastries, Ice Cream,  Soda (diet and regular), Sweet Tea, Candies, Chips, Cookies, Store Bought Juices, Alcohol in Excess of  1-2 drinks a day, Artificial Sweeteners, and "Sugar-free" Products. This will help patient to have stable blood glucose profile and potentially avoid unintended weight gain.  - Patient is advised to stick to a routine mealtimes to eat 3 meals  a day and avoid unnecessary snacks ( to snack only to correct hypoglycemia).   - I have approached patient with the following individualized plan to manage diabetes and patient agrees.  -  I advised her to continue low-dose metformin 500 mg by mouth twice a day after breakfast and supper, therapeutically suitable for patient.  - Patient will be considered for incretin therapy  If she loses control .  - Patient specific target  for A1c; LDL, HDL, Triglycerides, and  Waist Circumference were discussed in detail.  2) BP/HTN: Her blood pressure is not controlled to target.  Needs more medications.  I discussed with her the need for consistency taking her benazepril 40 mg p.o. daily, hydrochlorothiazide 25 mg p.o. daily, and discussed and added clonidine 0.1 mg p.o. twice daily.     3) Lipids/HPL: Her recent lipid panel showed LDL of 80.  She is advised to continue simvastatin 20 mg p.o. nightly.  Side effects and precautions discussed with her.   4)  Weight/Diet: CDE consult in progress, exercise, and carbohydrates information provided.  5) Chronic Care/Health Maintenance:  -Patient is on ACEI/ARB and Statin medications and encouraged to continue to follow up with Ophthalmology, Podiatrist at least yearly or according to recommendations, and advised to  stay away from smoking. I have recommended yearly flu vaccine and pneumonia vaccination at least every 5 years; moderate intensity exercise for up to 150 minutes weekly; and  sleep for at least 7 hours a day.  - I advised patient to maintain close follow up with Sinda Du, MD for primary care  needs.  - Time spent with the patient: 25 min, of which >50% was spent in reviewing her blood glucose logs , discussing her hypo- and hyper-glycemic episodes, reviewing her current and  previous labs and insulin doses and developing a plan to avoid hypo- and hyper-glycemia. Please refer to Patient Instructions for Blood Glucose Monitoring and Insulin/Medications Dosing Guide"  in media tab for additional information. Karen Rios participated in the discussions, expressed understanding, and voiced agreement with the above plans.  All questions were answered to her satisfaction. she is encouraged to contact clinic should she have any questions or concerns prior to her return visit.  Follow up plan: -Return in about 6 months (around 09/24/2018) for Follow up with Pre-visit Labs.  Glade Lloyd, MD Phone: [phone]  Fax: [phone]   -  This note was partially dictated with voice recognition software. Similar sounding words can be transcribed inadequately or may not  be corrected upon review.  03/26/2018, 1:30 PM

## 2018-03-30 ENCOUNTER — Encounter: Payer: Self-pay | Admitting: Nutrition

## 2018-03-30 NOTE — Patient Instructions (Addendum)
Keep up the great job! Goals 1 Lose 6-10 lbs in the next 6 months 2. Keep up the great job! Keep eating fresh fruits and vegetables.

## 2018-04-15 ENCOUNTER — Other Ambulatory Visit: Payer: Self-pay | Admitting: "Endocrinology

## 2018-05-25 ENCOUNTER — Encounter: Payer: Self-pay | Admitting: "Endocrinology

## 2018-06-04 ENCOUNTER — Other Ambulatory Visit (HOSPITAL_COMMUNITY): Payer: Self-pay | Admitting: Pulmonary Disease

## 2018-06-04 DIAGNOSIS — Z0000 Encounter for general adult medical examination without abnormal findings: Secondary | ICD-10-CM | POA: Diagnosis not present

## 2018-06-04 DIAGNOSIS — Z1231 Encounter for screening mammogram for malignant neoplasm of breast: Secondary | ICD-10-CM

## 2018-06-05 DIAGNOSIS — Z634 Disappearance and death of family member: Secondary | ICD-10-CM | POA: Diagnosis not present

## 2018-06-05 DIAGNOSIS — M1990 Unspecified osteoarthritis, unspecified site: Secondary | ICD-10-CM | POA: Diagnosis not present

## 2018-06-05 DIAGNOSIS — I10 Essential (primary) hypertension: Secondary | ICD-10-CM | POA: Diagnosis not present

## 2018-06-05 DIAGNOSIS — Z0000 Encounter for general adult medical examination without abnormal findings: Secondary | ICD-10-CM | POA: Diagnosis not present

## 2018-06-05 LAB — LIPID PANEL
Cholesterol: 151 (ref 0–200)
HDL: 53 (ref 35–70)
LDL Cholesterol: 73
Triglycerides: 126 (ref 40–160)

## 2018-06-05 LAB — TSH: TSH: 1 (ref <=5.90)

## 2018-06-05 LAB — BASIC METABOLIC PANEL: Glucose: 116

## 2018-06-08 ENCOUNTER — Encounter (HOSPITAL_COMMUNITY): Payer: Self-pay

## 2018-06-08 ENCOUNTER — Ambulatory Visit (HOSPITAL_COMMUNITY)
Admission: RE | Admit: 2018-06-08 | Discharge: 2018-06-08 | Disposition: A | Discharge status: Home / Self Care | Payer: Medicare Other | Source: Ambulatory Visit | Attending: Pulmonary Disease | Admitting: Pulmonary Disease

## 2018-06-08 DIAGNOSIS — Z1231 Encounter for screening mammogram for malignant neoplasm of breast: Secondary | ICD-10-CM | POA: Insufficient documentation

## 2018-06-08 LAB — HM MAMMOGRAPHY

## 2018-06-17 DIAGNOSIS — Z1211 Encounter for screening for malignant neoplasm of colon: Secondary | ICD-10-CM | POA: Diagnosis not present

## 2018-06-17 DIAGNOSIS — Z1212 Encounter for screening for malignant neoplasm of rectum: Secondary | ICD-10-CM | POA: Diagnosis not present

## 2018-06-17 LAB — COLOGUARD: Cologuard: NEGATIVE

## 2018-06-18 ENCOUNTER — Other Ambulatory Visit: Payer: Self-pay

## 2018-06-18 MED ORDER — HYDROCHLOROTHIAZIDE 25 MG PO TABS: 25.0000 mg | ORAL_TABLET | Freq: Every day | ORAL | 0 refills | Status: DC

## 2018-06-18 MED ORDER — SIMVASTATIN 20 MG PO TABS: ORAL_TABLET | ORAL | 0 refills | Status: DC

## 2018-06-18 MED ORDER — METFORMIN HCL 500 MG PO TABS: ORAL_TABLET | ORAL | 0 refills | Status: DC

## 2018-06-23 DIAGNOSIS — M545 Low back pain: Secondary | ICD-10-CM | POA: Diagnosis not present

## 2018-06-25 ENCOUNTER — Other Ambulatory Visit: Payer: Self-pay | Admitting: "Endocrinology

## 2018-09-09 ENCOUNTER — Other Ambulatory Visit: Payer: Self-pay | Admitting: "Endocrinology

## 2018-09-14 DIAGNOSIS — E119 Type 2 diabetes mellitus without complications: Secondary | ICD-10-CM | POA: Diagnosis not present

## 2018-09-15 LAB — COMPLETE METABOLIC PANEL WITH GFR
AG Ratio: 1.4 (calc) (ref 1.0–2.5)
ALBUMIN MSPROF: 4.3 g/dL (ref 3.6–5.1)
ALKALINE PHOSPHATASE (APISO): 70 U/L (ref 37–153)
ALT: 11 U/L (ref 6–29)
AST: 16 U/L (ref 10–35)
BILIRUBIN TOTAL: 0.7 mg/dL (ref 0.2–1.2)
BUN: 12 mg/dL (ref 7–25)
CHLORIDE: 102 mmol/L (ref 98–110)
CO2: 29 mmol/L (ref 20–32)
CREATININE: 0.71 mg/dL (ref 0.50–0.99)
Calcium: 9.7 mg/dL (ref 8.6–10.4)
GFR, Est African American: 105 mL/min/{1.73_m2} (ref >=60)
GFR, Est Non African American: 91 mL/min/{1.73_m2} (ref >=60)
GLOBULIN: 3 g/dL (ref 1.9–3.7)
Glucose, Bld: 103 mg/dL — ABNORMAL HIGH (ref 65–99)
POTASSIUM: 3.9 mmol/L (ref 3.5–5.3)
SODIUM: 139 mmol/L (ref 135–146)
Total Protein: 7.3 g/dL (ref 6.1–8.1)

## 2018-09-15 LAB — HEMOGLOBIN A1C
Hgb A1c MFr Bld: 5.7 % of total Hgb — ABNORMAL HIGH (ref <5.7)
MEAN PLASMA GLUCOSE: 117 (calc)
eAG (mmol/L): 6.5 (calc)

## 2018-09-15 LAB — T4, FREE: Free T4: 1.5 ng/dL (ref 0.8–1.8)

## 2018-09-15 LAB — TSH: TSH: 1.26 mIU/L (ref 0.40–4.50)

## 2018-09-24 ENCOUNTER — Encounter: Payer: Medicare Other | Attending: Pulmonary Disease | Admitting: Nutrition

## 2018-09-24 ENCOUNTER — Other Ambulatory Visit: Payer: Self-pay

## 2018-09-24 ENCOUNTER — Ambulatory Visit (INDEPENDENT_AMBULATORY_CARE_PROVIDER_SITE_OTHER): Payer: Medicare Other | Admitting: "Endocrinology

## 2018-09-24 ENCOUNTER — Encounter: Payer: Self-pay | Admitting: "Endocrinology

## 2018-09-24 VITALS — BP 157/92 | HR 88 | Ht 69.0 in | Wt 279.0 lb

## 2018-09-24 VITALS — Ht 69.0 in | Wt 272.0 lb

## 2018-09-24 DIAGNOSIS — I10 Essential (primary) hypertension: Secondary | ICD-10-CM

## 2018-09-24 DIAGNOSIS — E669 Obesity, unspecified: Secondary | ICD-10-CM | POA: Insufficient documentation

## 2018-09-24 DIAGNOSIS — E782 Mixed hyperlipidemia: Secondary | ICD-10-CM | POA: Diagnosis not present

## 2018-09-24 DIAGNOSIS — E119 Type 2 diabetes mellitus without complications: Secondary | ICD-10-CM | POA: Diagnosis not present

## 2018-09-24 MED ORDER — METFORMIN HCL 500 MG PO TABS: 500.0000 mg | ORAL_TABLET | Freq: Every day | ORAL | 2 refills | Status: DC

## 2018-09-24 MED ORDER — CLONIDINE HCL 0.2 MG PO TABS: 0.2000 mg | ORAL_TABLET | Freq: Two times a day (BID) | ORAL | 5 refills | Status: DC

## 2018-09-24 NOTE — Progress Notes (Signed)
Endocrinology follow-up note   Subjective:    Patient ID: Karen Rios, female    DOB: 02-27-1956, PCP Sinda Du, MD   Past Medical History:  Diagnosis Date  . Arthritis   . Diabetes mellitus without complication ([place])   . Hypertension    Past Surgical History:  Procedure Laterality Date  . TUBAL LIGATION     Social History   Socioeconomic History  . Marital status: Married    Spouse name: Not on file  . Number of children: Not on file  . Years of education: Not on file  . Highest education level: Not on file  Occupational History  . Not on file  Social Needs  . Financial resource strain: Not on file  . Food insecurity:    Worry: Not on file    Inability: Not on file  . Transportation needs:    Medical: Not on file    Non-medical: Not on file  Tobacco Use  . Smoking status: Never Smoker  . Smokeless tobacco: Never Used  Substance and Sexual Activity  . Alcohol use: No  . Drug use: No  . Sexual activity: Not on file  Lifestyle  . Physical activity:    Days per week: Not on file    Minutes per session: Not on file  . Stress: Not on file  Relationships  . Social connections:    Talks on phone: Not on file    Gets together: Not on file    Attends religious service: Not on file    Active member of club or organization: Not on file    Attends meetings of clubs or organizations: Not on file    Relationship status: Not on file  Other Topics Concern  . Not on file  Social History Narrative  . Not on file   Outpatient Encounter Medications as of 09/24/2018  Medication Sig  . ACCU-CHEK AVIVA PLUS test strip USE TO TEST 4 TIMES DAILY.  Marland Kitchen Blood Glucose Monitoring Suppl (ACCU-CHEK AVIVA PLUS) w/Device KIT Test BG 4 x daily. e11.65  . cloNIDine (CATAPRES) 0.2 MG tablet Take 1 tablet (0.2 mg total) by mouth 2 (two) times daily.  . hydrochlorothiazide (HYDRODIURIL) 25 MG tablet TAKE 1 TABLET BY MOUTH ONCE DAILY.  Marland Kitchen lisinopril (PRINIVIL,ZESTRIL) 40 MG  tablet daily.  . metFORMIN (GLUCOPHAGE) 500 MG tablet Take 1 tablet (500 mg total) by mouth daily with breakfast.  . Multiple Vitamins-Minerals (CENTRUM SILVER ULTRA WOMENS) TABS Take 1 tablet by mouth every morning.  . simvastatin (ZOCOR) 20 MG tablet TAKE 1 TABLET BY MOUTH AT ONCE DAILY.  . [DISCONTINUED] benazepril (LOTENSIN) 40 MG tablet TAKE 1 TABLET BY MOUTH EVERY DAY  . [DISCONTINUED] cloNIDine (CATAPRES) 0.1 MG tablet Take 1 tablet (0.1 mg total) by mouth 2 (two) times daily.  . [DISCONTINUED] metFORMIN (GLUCOPHAGE) 500 MG tablet TAKE (1) TABLET BY MOUTH TWICE DAILY WITH A MEAL.   No facility-administered encounter medications on file as of 09/24/2018.    ALLERGIES: No Known Allergies VACCINATION STATUS:  There is no immunization history on file for this patient.  Diabetes  She presents for her follow-up diabetic visit. She has type 2 diabetes mellitus. Onset time: She was diagnosed at approximate age of 54 years. Her disease course has been stable (She has improved her A1c from 18.8% to 5.7% !). There are no hypoglycemic associated symptoms. Pertinent negatives for hypoglycemia include no confusion, headaches, pallor or seizures. There are no diabetic associated symptoms. Pertinent negatives for diabetes include  no chest pain, no polydipsia, no polyphagia and no polyuria. There are no hypoglycemic complications. Symptoms are stable. There are no diabetic complications. Risk factors for coronary artery disease include diabetes mellitus, dyslipidemia, hypertension, obesity, sedentary lifestyle and post-menopausal. Current diabetic treatment includes oral agent (monotherapy). She is compliant with treatment most of the time. Her weight is decreasing steadily. She is following a diabetic diet. When asked about meal planning, she reported none. She has had a previous visit with a dietitian. She participates in exercise intermittently. An ACE inhibitor/angiotensin II receptor blocker is being  taken.  Hyperlipidemia  This is a chronic problem. The current episode started more than 1 year ago. The problem is controlled. Exacerbating diseases include diabetes. Pertinent negatives include no chest pain, focal sensory loss, myalgias or shortness of breath. Current antihyperlipidemic treatment includes statins.  Hypertension  This is a chronic problem. The current episode started more than 1 year ago. The problem is uncontrolled. Pertinent negatives include no chest pain, headaches, palpitations or shortness of breath. Risk factors for coronary artery disease include diabetes mellitus, obesity, sedentary lifestyle and post-menopausal state. Past treatments include ACE inhibitors and diuretics. The current treatment provides mild improvement. Compliance problems include medication cost.      Review of Systems  Constitutional: Negative for chills, fever and unexpected weight change.  HENT: Negative for trouble swallowing and voice change.   Eyes: Negative for visual disturbance.  Respiratory: Negative for cough, shortness of breath and wheezing.   Cardiovascular: Negative for chest pain, palpitations and leg swelling.  Gastrointestinal: Negative for diarrhea, nausea and vomiting.  Endocrine: Negative for cold intolerance, heat intolerance, polydipsia, polyphagia and polyuria.  Musculoskeletal: Negative for arthralgias and myalgias.  Skin: Negative for color change, pallor, rash and wound.  Neurological: Negative for seizures and headaches.  Psychiatric/Behavioral: Negative for confusion and suicidal ideas.    Objective:    BP (!) 157/92   Pulse 88   Ht '5\' 9"'  (1.753 m)   Wt 279 lb (126.6 kg)   BMI 41.20 kg/m   Wt Readings from Last 3 Encounters:  09/24/18 279 lb (126.6 kg)  03/26/18 287 lb (130.2 kg)  09/23/17 284 lb (128.8 kg)    Physical Exam  Constitutional: She is oriented to person, place, and time. She appears well-developed.  Poor general personal hygiene.  HENT:   Head: Normocephalic and atraumatic.  Eyes: EOM are normal.  Neck: Normal range of motion. Neck supple. No tracheal deviation present. No thyromegaly present.  Cardiovascular: Normal rate and regular rhythm.  Pulmonary/Chest: Effort normal.  Abdominal: Bowel sounds are normal. There is no abdominal tenderness. There is no guarding.  Musculoskeletal: Normal range of motion.        General: No edema.  Neurological: She is alert and oriented to person, place, and time. No cranial nerve deficit. Coordination normal.  Skin: Skin is warm and dry. No rash noted. No erythema. No pallor.  Psychiatric: She has a normal mood and affect. Judgment normal.    CMP     Component Value Date/Time   NA 139 09/14/2018 0724   K 3.9 09/14/2018 0724   CL 102 09/14/2018 0724   CO2 29 09/14/2018 0724   GLUCOSE 103 (H) 09/14/2018 0724   BUN 12 09/14/2018 0724   CREATININE 0.71 09/14/2018 0724   CALCIUM 9.7 09/14/2018 0724   PROT 7.3 09/14/2018 0724   ALBUMIN 4.1 11/14/2016 0720   AST 16 09/14/2018 0724   ALT 11 09/14/2018 0724   ALKPHOS 66 11/14/2016 0720  BILITOT 0.7 09/14/2018 0724   GFRNONAA 91 09/14/2018 0724   GFRAA 105 09/14/2018 0724     Diabetic Labs (most recent): Lab Results  Component Value Date   HGBA1C 5.7 (H) 09/14/2018   HGBA1C 5.8 (H) 03/17/2018   HGBA1C 5.8 (H) 09/16/2017      Assessment & Plan:   1. Uncontrolled type 2 diabetes mellitus without complication, without long-term current use of insulin ([place])  -She  remains at a high risk for more acute and chronic complications of diabetes which include CAD, CVA, CKD, retinopathy, and neuropathy. These are all discussed in detail with the patient.  -She returns with stable A1c of 5.7%.    -  Recent labs reviewed.  - I have re-counseled the patient on diet managemweight loss   by adopting a carbohydrate restricted / protein rich  Diet.  - Patient admits there is a room for improvement in her diet and drink choices. -   Suggestion is made for her to avoid simple carbohydrates  from her diet including Cakes, Sweet Desserts / Pastries, Ice Cream, Soda (diet and regular), Sweet Tea, Candies, Chips, Cookies, Store Bought Juices, Alcohol in Excess of  1-2 drinks a day, Artificial Sweeteners, and "Sugar-free" Products. This will help patient to have stable blood glucose profile and potentially avoid unintended weight gain.  - Patient is advised to stick to a routine mealtimes to eat 3 meals  a day and avoid unnecessary snacks ( to snack only to correct hypoglycemia).   - I have approached patient with the following individualized plan to manage diabetes and patient agrees.  -She is advised to lower her metformin to 500 mg p.o. daily at breakfast.  - Patient will be considered for incretin therapy  If she loses control .  - Patient specific target  for A1c; LDL, HDL, Triglycerides, and  Waist Circumference were discussed in detail.  2) BP/HTN: Her blood pressure is not controlled to target.  I discussed and increase his clonidine to 0.2 mg p.o. twice daily, continue benazepril 40 mg p.o. daily, hydrochlorothiazide 25 mg p.o. daily.      3) Lipids/HPL: Her recent lipid panel showed LDL of 80.  She is advised to continue simvastatin 20 mg p.o. nightly.  Side effects and precautions discussed with her.   4)  Weight/Diet: CDE consult in progress, exercise, and carbohydrates information provided.  5) Chronic Care/Health Maintenance:  -Patient is on ACEI/ARB and Statin medications and encouraged to continue to follow up with Ophthalmology, Podiatrist at least yearly or according to recommendations, and advised to  stay away from smoking. I have recommended yearly flu vaccine and pneumonia vaccination at least every 5 years; moderate intensity exercise for up to 150 minutes weekly; and  sleep for at least 7 hours a day.  - I advised patient to maintain close follow up with Sinda Du, MD for primary care needs.  -  Time spent with the patient: 25 min, of which >50% was spent in reviewing her  current and  previous labs/studies, previous treatments, and medications doses and developing a plan for long-term care based on the latest recommendations for standards of care. Please refer to " Patient Self Inventory" in the Media  tab for reviewed elements of pertinent patient history. Karen Rios participated in the discussions, expressed understanding, and voiced agreement with the above plans.  All questions were answered to her satisfaction. she is encouraged to contact clinic should she have any questions or concerns prior to her return  visit.  Follow up plan: -Return in about 6 months (around 03/27/2019) for Follow up with Pre-visit Labs.  Glade Lloyd, MD Phone: [phone]  Fax: [phone]   -  This note was partially dictated with voice recognition software. Similar sounding words can be transcribed inadequately or may not  be corrected upon review.  09/24/2018, 12:02 PM

## 2018-09-24 NOTE — Progress Notes (Signed)
   Medical Nutrition Therapy:  Appt start time: 1000  end time:  1015 Assessment:  Assessment:  Primary concerns today: Diabetes Type 2. .  and lost 4 lbs.  Saw Dr. Fransico Him, Endocrinology today. Metformin 500 mg a day.  . A1C 5.7%. FBS 100's in am. Feels great. Eating well balanced meals.  Going to [facility]  4 times per week  and does water aerobics and uses equipment. Working on weight loss. Cut out coffee and no junk food.  Making excellent progress Denies any low blood sugars. BS in am 90-100's.  Lab Results  Component Value Date   HGBA1C 5.7 (H) 09/14/2018   Vitals with BMI 03/26/2018  Height 5\' 9"   Weight 287 lbs  BMI 42.36  Systolic 166  Diastolic 94  Pulse 92  Respirations     Preferred Learning Style:     No preference indicated   Learning Readiness:   Ready  Change in progress  MEDICATIONS: see list   DIETARY INTAKE:  24-hr recall:  B ( AM): Oatmeal boiled egg and fruit, water  Snk ( AM): none L ( PM):Broccoli, cottage cheese, chicken breast, water or skim milk. D ( PM): Toss salad and chicken, and fruit, water    Usual physical activity:  Goinng to [facility] for water aerobics 4 times per week and walking in the pool  Estimated energy needs: 1800 calories 200 g carbohydrates 135 g protein 50 g fat  Progress Towards Goal(s):  In progress.   Nutritional Diagnosis:  NB-1.1 Food and nutrition-related knowledge deficit As related to Diabetes.  As evidenced by A1C that was 18% and now down to 5.5% .    Intervention:  Nutrition counseling and diabetes education on meal planning with My Plate, carb counting, portion sizes, weight loss tips, benefits of exercise and prevention of complications from DM.  Weight loss tips and increasing physical activity. Avoiding salty processed foods.  Goals 1 Lose 6-10 lbs in the next 6 months 2. Keep up the great job! Keep eating fresh fruits and vegetables.   Teaching Method Utilized:Visual Auditory Hands on  Handouts given  during visit include: The Plate Method Meal Plan Card  Barriers to learning/adherence to lifestyle change: none  Demonstrated degree of understanding via:  Teach Back   Monitoring/Evaluation:  Dietary intake, exercise, meal planning, and body weight in 6 month(s) to continue to work on weight loss. Marland Kitchen

## 2018-09-24 NOTE — Patient Instructions (Signed)

## 2018-10-13 ENCOUNTER — Encounter: Payer: Self-pay | Admitting: Nutrition

## 2018-10-13 NOTE — Patient Instructions (Signed)
Goals 1 Lose 6-10 lbs in the next 6 months 2. Keep up the great job! Keep eating fresh fruits and vegetables. 

## 2018-12-03 DIAGNOSIS — E119 Type 2 diabetes mellitus without complications: Secondary | ICD-10-CM | POA: Diagnosis not present

## 2018-12-03 DIAGNOSIS — I10 Essential (primary) hypertension: Secondary | ICD-10-CM | POA: Diagnosis not present

## 2018-12-03 DIAGNOSIS — M545 Low back pain: Secondary | ICD-10-CM | POA: Diagnosis not present

## 2018-12-03 DIAGNOSIS — E785 Hyperlipidemia, unspecified: Secondary | ICD-10-CM | POA: Diagnosis not present

## 2018-12-26 ENCOUNTER — Other Ambulatory Visit: Payer: Self-pay | Admitting: "Endocrinology

## 2019-01-25 ENCOUNTER — Other Ambulatory Visit: Payer: Self-pay | Admitting: "Endocrinology

## 2019-02-01 ENCOUNTER — Ambulatory Visit: Payer: Self-pay | Admitting: "Endocrinology

## 2019-02-02 ENCOUNTER — Other Ambulatory Visit: Payer: Self-pay | Admitting: "Endocrinology

## 2019-02-10 ENCOUNTER — Other Ambulatory Visit: Payer: Self-pay | Admitting: "Endocrinology

## 2019-02-25 ENCOUNTER — Other Ambulatory Visit: Payer: Self-pay | Admitting: "Endocrinology

## 2019-03-05 DIAGNOSIS — E119 Type 2 diabetes mellitus without complications: Secondary | ICD-10-CM | POA: Diagnosis not present

## 2019-03-05 DIAGNOSIS — E785 Hyperlipidemia, unspecified: Secondary | ICD-10-CM | POA: Diagnosis not present

## 2019-03-05 DIAGNOSIS — I10 Essential (primary) hypertension: Secondary | ICD-10-CM | POA: Diagnosis not present

## 2019-03-23 DIAGNOSIS — E119 Type 2 diabetes mellitus without complications: Secondary | ICD-10-CM | POA: Diagnosis not present

## 2019-03-24 LAB — COMPLETE METABOLIC PANEL WITH GFR
AG Ratio: 1.4 (calc) (ref 1.0–2.5)
ALT: 15 U/L (ref 6–29)
AST: 15 U/L (ref 10–35)
Albumin: 4.2 g/dL (ref 3.6–5.1)
Alkaline phosphatase (APISO): 79 U/L (ref 37–153)
BUN: 15 mg/dL (ref 7–25)
CO2: 25 mmol/L (ref 20–32)
Calcium: 9.6 mg/dL (ref 8.6–10.4)
Chloride: 105 mmol/L (ref 98–110)
Creat: 0.81 mg/dL (ref 0.50–0.99)
GFR, Est African American: 89 mL/min/{1.73_m2} (ref >=60)
GFR, Est Non African American: 77 mL/min/{1.73_m2} (ref >=60)
Globulin: 2.9 g/dL (calc) (ref 1.9–3.7)
Glucose, Bld: 123 mg/dL — ABNORMAL HIGH (ref 65–99)
Potassium: 4.1 mmol/L (ref 3.5–5.3)
Sodium: 142 mmol/L (ref 135–146)
Total Bilirubin: 0.4 mg/dL (ref 0.2–1.2)
Total Protein: 7.1 g/dL (ref 6.1–8.1)

## 2019-03-24 LAB — HEMOGLOBIN A1C
Hgb A1c MFr Bld: 6.5 % of total Hgb — ABNORMAL HIGH (ref <5.7)
Mean Plasma Glucose: 140 (calc)
eAG (mmol/L): 7.7 (calc)

## 2019-03-29 ENCOUNTER — Other Ambulatory Visit: Payer: Self-pay

## 2019-03-29 ENCOUNTER — Encounter: Payer: Self-pay | Admitting: "Endocrinology

## 2019-03-29 ENCOUNTER — Ambulatory Visit (INDEPENDENT_AMBULATORY_CARE_PROVIDER_SITE_OTHER): Payer: Medicare Other | Admitting: "Endocrinology

## 2019-03-29 DIAGNOSIS — I10 Essential (primary) hypertension: Secondary | ICD-10-CM

## 2019-03-29 DIAGNOSIS — E119 Type 2 diabetes mellitus without complications: Secondary | ICD-10-CM | POA: Diagnosis not present

## 2019-03-29 DIAGNOSIS — E782 Mixed hyperlipidemia: Secondary | ICD-10-CM

## 2019-03-29 MED ORDER — METFORMIN HCL 500 MG PO TABS: 500.0000 mg | ORAL_TABLET | Freq: Two times a day (BID) | ORAL | 1 refills | Status: DC

## 2019-03-29 NOTE — Progress Notes (Signed)
03/29/2019                                                      Endocrinology Telehealth Visit Follow up Note -During COVID -19 Pandemic  This visit type was conducted due to national recommendations for restrictions regarding the COVID-19 Pandemic  in an effort to limit this patient's exposure and mitigate transmission of the corona virus.  Due to her co-morbid illnesses, Karen Rios is at  moderate to high risk for complications without adequate follow up.  This format is felt to be most appropriate for her at this time.  I connected with this patient on 03/29/2019   by telephone and verified that I am speaking with the correct person using two identifiers. Karen Rios, 1955-10-22. she has verbally consented to this visit. All issues noted in this document were discussed and addressed. The format was not optimal for physical exam.     Subjective:    Patient ID: Karen Rios, female    DOB: 1955-11-22, PCP Karen Du, MD   Past Medical History:  Diagnosis Date  . Arthritis   . Diabetes mellitus without complication ([place])   . Hypertension    Past Surgical History:  Procedure Laterality Date  . TUBAL LIGATION     Social History   Socioeconomic History  . Marital status: Married    Spouse name: Not on file  . Number of children: Not on file  . Years of education: Not on file  . Highest education level: Not on file  Occupational History  . Not on file  Social Needs  . Financial resource strain: Not on file  . Food insecurity    Worry: Not on file    Inability: Not on file  . Transportation needs    Medical: Not on file    Non-medical: Not on file  Tobacco Use  . Smoking status: Never Smoker  . Smokeless tobacco: Never Used  Substance and Sexual Activity  . Alcohol use: No  . Drug use: No  . Sexual activity: Not on file  Lifestyle  . Physical activity    Days per week: Not on file    Minutes per session: Not on file  . Stress: Not on file   Relationships  . Social Herbalist on phone: Not on file    Gets together: Not on file    Attends religious service: Not on file    Active member of club or organization: Not on file    Attends meetings of clubs or organizations: Not on file    Relationship status: Not on file  Other Topics Concern  . Not on file  Social History Narrative  . Not on file   Outpatient Encounter Medications as of 03/29/2019  Medication Sig  . ACCU-CHEK AVIVA PLUS test strip USE TO TEST 4 TIMES DAILY.  Marland Kitchen Blood Glucose Monitoring Suppl (ACCU-CHEK AVIVA PLUS) w/Device KIT Test BG 4 x daily. e11.65  . cloNIDine (CATAPRES) 0.2 MG tablet Take 1 tablet (0.2 mg total) by mouth 2 (two) times daily.  . hydrochlorothiazide (HYDRODIURIL) 25 MG tablet TAKE 1 TABLET BY MOUTH ONCE DAILY.  Marland Kitchen lisinopril (PRINIVIL,ZESTRIL) 40 MG tablet daily.  . metFORMIN (GLUCOPHAGE) 500 MG tablet Take 1 tablet (500 mg total) by mouth 2 (two) times daily with  a meal.  . Multiple Vitamins-Minerals (CENTRUM SILVER ULTRA WOMENS) TABS Take 1 tablet by mouth every morning.  . simvastatin (ZOCOR) 20 MG tablet TAKE 1 TABLET BY MOUTH ONCE DAILY.  . [DISCONTINUED] metFORMIN (GLUCOPHAGE) 500 MG tablet TAKE (1) TABLET BY MOUTH TWICE DAILY WITH A MEAL.   No facility-administered encounter medications on file as of 03/29/2019.    ALLERGIES: No Known Allergies VACCINATION STATUS:  There is no immunization history on file for this patient.  Diabetes She presents for her follow-up diabetic visit. She has type 2 diabetes mellitus. Onset time: She was diagnosed at approximate age of 44 years. Her disease course has been stable (She has improved her A1c from 18.8% to 5.7% !). There are no hypoglycemic associated symptoms. Pertinent negatives for hypoglycemia include no confusion, headaches, pallor or seizures. There are no diabetic associated symptoms. Pertinent negatives for diabetes include no chest pain, no polydipsia, no polyphagia and no  polyuria. There are no hypoglycemic complications. Symptoms are stable. There are no diabetic complications. Risk factors for coronary artery disease include diabetes mellitus, dyslipidemia, hypertension, obesity, sedentary lifestyle and post-menopausal. Current diabetic treatment includes oral agent (monotherapy). She is compliant with treatment most of the time. She is following a diabetic diet. When asked about meal planning, she reported none. She has had a previous visit with a dietitian. She participates in exercise intermittently. An ACE inhibitor/angiotensin II receptor blocker is being taken.  Hyperlipidemia This is a chronic problem. The current episode started more than 1 year ago. The problem is controlled. Exacerbating diseases include diabetes. Pertinent negatives include no chest pain, focal sensory loss, myalgias or shortness of breath. Current antihyperlipidemic treatment includes statins.  Hypertension This is a chronic problem. The current episode started more than 1 year ago. The problem is uncontrolled. Pertinent negatives include no chest pain, headaches, palpitations or shortness of breath. Risk factors for coronary artery disease include diabetes mellitus, obesity, sedentary lifestyle and post-menopausal state. Past treatments include ACE inhibitors and diuretics. The current treatment provides mild improvement. Compliance problems include medication cost.    Review of systems: Limited as above.  Objective:    There were no vitals taken for this visit.  Wt Readings from Last 3 Encounters:  09/24/18 272 lb (123.4 kg)  09/24/18 279 lb (126.6 kg)  03/26/18 287 lb (130.2 kg)      CMP     Component Value Date/Time   NA 142 03/23/2019 0734   K 4.1 03/23/2019 0734   CL 105 03/23/2019 0734   CO2 25 03/23/2019 0734   GLUCOSE 123 (H) 03/23/2019 0734   BUN 15 03/23/2019 0734   CREATININE 0.81 03/23/2019 0734   CALCIUM 9.6 03/23/2019 0734   PROT 7.1 03/23/2019 0734   ALBUMIN  4.1 11/14/2016 0720   AST 15 03/23/2019 0734   ALT 15 03/23/2019 0734   ALKPHOS 66 11/14/2016 0720   BILITOT 0.4 03/23/2019 0734   GFRNONAA 77 03/23/2019 0734   GFRAA 89 03/23/2019 0734     Diabetic Labs (most recent): Lab Results  Component Value Date   HGBA1C 6.5 (H) 03/23/2019   HGBA1C 5.7 (H) 09/14/2018   HGBA1C 5.8 (H) 03/17/2018      Assessment & Plan:   1. Uncontrolled type 2 diabetes mellitus without complication, without long-term current use of insulin ([place])  -She  remains at a high risk for more acute and chronic complications of diabetes which include CAD, CVA, CKD, retinopathy, and neuropathy. These are all discussed in detail with the patient.  -  Her previsit labs show A1c of 6.5%.   -  Recent labs reviewed.  - I have re-counseled the patient on diet managemweight loss   by adopting a carbohydrate restricted / protein rich  Diet.  - she  admits there is a room for improvement in her diet and drink choices. -  Suggestion is made for her to avoid simple carbohydrates  from her diet including Cakes, Sweet Desserts / Pastries, Ice Cream, Soda (diet and regular), Sweet Tea, Candies, Chips, Cookies, Sweet Pastries,  Store Bought Juices, Alcohol in Excess of  1-2 drinks a day, Artificial Sweeteners, Coffee Creamer, and "Sugar-free" Products. This will help patient to have stable blood glucose profile and potentially avoid unintended weight gain.   - Patient is advised to stick to a routine mealtimes to eat 3 meals  a day and avoid unnecessary snacks ( to snack only to correct hypoglycemia).   - I have approached patient with the following individualized plan to manage diabetes and patient agrees.  -She is advised to crease metformin to 100 mg p.o. twice daily.    - Patient will be considered for incretin therapy  If she loses control .  - Patient specific target  for A1c; LDL, HDL, Triglycerides, and  Waist Circumference were discussed in detail.  2) BP/HTN: she is  advised to home monitor blood pressure and report if > 140/90 on 2 separate readings. -She is advised to continue clonidine 0.2 mg p.o. twice daily, benazepril 40 mg p.o. daily, and hydrochlorothiazide 25 mg p.o. daily.    3) Lipids/HPL: Her recent lipid panel showed LDL of 80.  She is advised to continue simvastatin 20 mg p.o. nightly.   Side effects and precautions discussed with her.   4)  Weight/Diet: CDE consult in progress, exercise, and carbohydrates information provided.  5) Chronic Care/Health Maintenance:  -Patient is on ACEI/ARB and Statin medications and encouraged to continue to follow up with Ophthalmology, Podiatrist at least yearly or according to recommendations, and advised to  stay away from smoking. I have recommended yearly flu vaccine and pneumonia vaccination at least every 5 years; moderate intensity exercise for up to 150 minutes weekly; and  sleep for at least 7 hours a day.  - I advised patient to maintain close follow up with Karen Du, MD for primary care needs.  - Patient Care Time Today:  25 min, of which >50% was spent in  counseling and the rest reviewing her  current and  previous labs/studies, previous treatments, her blood glucose readings, and medications' doses and developing a plan for long-term care based on the latest recommendations for standards of care.   Karen Rios participated in the discussions, expressed understanding, and voiced agreement with the above plans.  All questions were answered to her satisfaction. she is encouraged to contact clinic should she have any questions or concerns prior to her return visit.   Follow up plan: -Return in about 6 months (around 09/26/2019) for Next Visit A1c in Office.  Glade Lloyd, MD Phone: [phone]  Fax: [phone]   -  This note was partially dictated with voice recognition software. Similar sounding words can be transcribed inadequately or may not  be corrected upon review.  03/29/2019,  4:53 PM

## 2019-04-16 DIAGNOSIS — I10 Essential (primary) hypertension: Secondary | ICD-10-CM | POA: Diagnosis not present

## 2019-04-16 DIAGNOSIS — M545 Low back pain: Secondary | ICD-10-CM | POA: Diagnosis not present

## 2019-04-16 DIAGNOSIS — E119 Type 2 diabetes mellitus without complications: Secondary | ICD-10-CM | POA: Diagnosis not present

## 2019-06-14 ENCOUNTER — Other Ambulatory Visit: Payer: Self-pay | Admitting: "Endocrinology

## 2019-07-14 ENCOUNTER — Other Ambulatory Visit: Payer: Self-pay

## 2019-07-14 ENCOUNTER — Encounter: Payer: Self-pay | Admitting: Family Medicine

## 2019-07-14 ENCOUNTER — Ambulatory Visit (INDEPENDENT_AMBULATORY_CARE_PROVIDER_SITE_OTHER): Payer: Medicare Other | Admitting: Family Medicine

## 2019-07-14 VITALS — BP 175/105 | HR 102 | Temp 98.4°F | Ht 69.0 in | Wt 292.4 lb

## 2019-07-14 DIAGNOSIS — E782 Mixed hyperlipidemia: Secondary | ICD-10-CM | POA: Diagnosis not present

## 2019-07-14 DIAGNOSIS — I10 Essential (primary) hypertension: Secondary | ICD-10-CM | POA: Diagnosis not present

## 2019-07-14 MED ORDER — CLONIDINE HCL 0.3 MG PO TABS: 0.3000 mg | ORAL_TABLET | Freq: Two times a day (BID) | ORAL | 1 refills | Status: DC

## 2019-07-14 MED ORDER — LOSARTAN POTASSIUM 100 MG PO TABS: 100.0000 mg | ORAL_TABLET | Freq: Every day | ORAL | 1 refills | Status: DC

## 2019-07-14 NOTE — Progress Notes (Signed)
New Patient Office Visit  Subjective:  Patient ID: Karen Rios, female    DOB: 18-Jul-1955  Age: 63 y.o. MRN: [id number]  CC:  Chief Complaint  Patient presents with  . Establish Care  HTN-clonidine/lisinopril/HCTZ  HPI Karen Rios presents for  HTN-concern for elevation of blood pressure, recently increased to clonidine BID .0.3 /HCTZ/lisinopril-pt with concern for continued elevation-recent elevation-with clonidine increased to 0,3 for 0.2 BID. No headache, no CP/no SOB Right ankle -fracture-disabled due to limited mobility DM-endo following, seen by nutrition/diabetic education-reviewed Hyperlipidemia-stable  Past Medical History:  Diagnosis Date  . Arthritis   . Diabetes mellitus without complication ([place])   . Hypertension     Past Surgical History:  Procedure Laterality Date  . TUBAL LIGATION      Family History  Problem Relation Age of Onset  . Diabetes Mother   . Hypertension Mother     Social History   Socioeconomic History  . Marital status: Widowed    Spouse name: Not on file  . Number of children: Not on file  . Years of education: Not on file  . Highest education level: Not on file  Occupational History  . Occupation: retired  Tobacco Use  . Smoking status: Never Smoker  . Smokeless tobacco: Never Used  Substance and Sexual Activity  . Alcohol use: No  . Drug use: No  . Sexual activity: Not on file  Other Topics Concern  . Not on file  Social History Narrative  . Not on file   Social Determinants of Health   Financial Resource Strain:   . Difficulty of Paying Living Expenses: Not on file  Food Insecurity:   . Worried About Charity fundraiser in the Last Year: Not on file  . Ran Out of Food in the Last Year: Not on file  Transportation Needs:   . Lack of Transportation (Medical): Not on file  . Lack of Transportation (Non-Medical): Not on file  Physical Activity:   . Days of Exercise per Week: Not on file  . Minutes of  Exercise per Session: Not on file  Stress:   . Feeling of Stress : Not on file  Social Connections:   . Frequency of Communication with Friends and Family: Not on file  . Frequency of Social Gatherings with Friends and Family: Not on file  . Attends Religious Services: Not on file  . Active Member of Clubs or Organizations: Not on file  . Attends Archivist Meetings: Not on file  . Marital Status: Not on file  Intimate Partner Violence:   . Fear of Current or Ex-Partner: Not on file  . Emotionally Abused: Not on file  . Physically Abused: Not on file  . Sexually Abused: Not on file    ROS Review of Systems  HENT: Negative.   Respiratory: Negative.   Gastrointestinal: Negative.   Endocrine:       DM  Genitourinary: Negative.   Musculoskeletal:       Back pain  Neurological: Negative.     Objective:   Today's Vitals: BP (!) 175/105 (BP Location: Left Arm, Patient Position: Sitting, Cuff Size: Normal)   Pulse (!) 102   Temp 98.4 F (36.9 C) (Oral)   Ht '5\' 9"'  (1.753 m)   Wt 292 lb 6.4 oz (132.6 kg)   SpO2 97%   BMI 43.18 kg/m   Physical Exam Constitutional:      Appearance: Normal appearance.  HENT:     Head:  Normocephalic and atraumatic.  Cardiovascular:     Rate and Rhythm: Normal rate and regular rhythm.     Pulses: Normal pulses.     Heart sounds: Normal heart sounds.  Pulmonary:     Breath sounds: Normal breath sounds.  Musculoskeletal:        General: Normal range of motion.     Cervical back: Normal range of motion and neck supple.  Neurological:     General: No focal deficit present.     Mental Status: She is alert and oriented to person, place, and time.  Psychiatric:        Mood and Affect: Mood normal.        Behavior: Behavior normal.     Assessment & Plan:  1. Mixed hyperlipidemia Simvastatin-stable-lft normal  2. Essential hypertension Change from lisinopril to Losartan-d/w pt goal 130/80-take bp at home F/u 1 month -renal  function normal-reviewed labwork  Seen by endo for DM  Outpatient Encounter Medications as of 07/14/2019  Medication Sig  . Ascorbic Acid (VITAMIN C) 1000 MG tablet Take 1,000 mg by mouth daily.  Marland Kitchen aspirin EC 81 MG tablet Take 81 mg by mouth daily.  . [place]-Glucosamine-Vit D (OSTEO BI-FLEX ONE PER DAY PO) Take 2 tablets by mouth daily.  . Cholecalciferol (VITAMIN D) 125 MCG (5000 UT) CAPS Take 5,000 Units by mouth daily.  Marland Kitchen ACCU-CHEK AVIVA PLUS test strip USE TO TEST 4 TIMES DAILY.  Marland Kitchen Blood Glucose Monitoring Suppl (ACCU-CHEK AVIVA PLUS) w/Device KIT Test BG 4 x daily. e11.65  . cloNIDine (CATAPRES) 0.2 MG tablet Take 1 tablet (0.2 mg total) by mouth 2 (two) times daily.  . hydrochlorothiazide (HYDRODIURIL) 25 MG tablet TAKE 1 TABLET BY MOUTH ONCE DAILY.  Marland Kitchen lisinopril (PRINIVIL,ZESTRIL) 40 MG tablet daily.  . metFORMIN (GLUCOPHAGE) 500 MG tablet Take 1 tablet (500 mg total) by mouth 2 (two) times daily with a meal.  . Multiple Vitamins-Minerals (CENTRUM SILVER ULTRA WOMENS) TABS Take 1 tablet by mouth every morning.  . simvastatin (ZOCOR) 20 MG tablet TAKE 1 TABLET BY MOUTH ONCE DAILY.   No facility-administered encounter medications on file as of 07/14/2019.    Follow-up: 1 month   Karen Riepe Hannah Beat, MD

## 2019-07-14 NOTE — Patient Instructions (Addendum)
Take blood pressure at home -write down-first thing upon waking , in the evening Clonidine TWICE A DAY HCTZ-take in the morning Cozaar-take at night-STOP LISINOPRIL Zocor-take at night

## 2019-07-23 ENCOUNTER — Other Ambulatory Visit: Payer: Self-pay | Admitting: "Endocrinology

## 2019-08-16 ENCOUNTER — Other Ambulatory Visit: Payer: Self-pay

## 2019-08-16 ENCOUNTER — Telehealth (INDEPENDENT_AMBULATORY_CARE_PROVIDER_SITE_OTHER): Payer: Medicare Other | Admitting: Family Medicine

## 2019-08-16 ENCOUNTER — Encounter: Payer: Self-pay | Admitting: Family Medicine

## 2019-08-16 VITALS — BP 160/70 | Ht 69.0 in | Wt 292.0 lb

## 2019-08-16 DIAGNOSIS — I10 Essential (primary) hypertension: Secondary | ICD-10-CM | POA: Diagnosis not present

## 2019-08-17 NOTE — Progress Notes (Signed)
Virtual Visit via Telephone Note  I connected with Karen Rios on 08/16/19 at 10:40 AM EST by telephone and verified that I am speaking with the correct person using two identifiers. DOB/address  Location: Patient: home Provider: office I discussed the limitations, risks, security and privacy concerns of performing an evaluation and management service by telephone and the availability of in person appointments. I also discussed with the patient that there may be a patient responsible charge related to this service. The patient expressed understanding and agreed to proceed.   History of Present Illness: Pt states bp pressure has improved. A friend is coming to take pts bp at home. No headaches, no visual change, no dizziness, no LE edema  pt taking HCTZ am and cozaar pm. Pt states Dr. Juanetta Gosling told her to take Clonidine TID.  Pt does not remember if she has taken amlodipine Observations/Objective: 160/70-140/70  Assessment and Plan:  1. Essential hypertension Pt taking HCTZ, Cozaar and Clonidine-will check with pharmacy on rx since confusion on clonidine BID vs TID and if pt has ever used amlodipine Follow Up Instructions: Recheck bp daily for 2 weeks-follow up-previously pt took 5mg  amlodipine. Will restart if readings continue to be elevated   I discussed the assessment and treatment plan with the patient. The patient was provided an opportunity to ask questions and all were answered. The patient agreed with the plan and demonstrated an understanding of the instructions.   The patient was advised to call back or seek an in-person evaluation if the symptoms worsen or if the condition fails to improve as anticipated.  I provided 12 minutes of non-face-to-face time during this encounter.   Malacai Grantz , MD

## 2019-08-18 ENCOUNTER — Other Ambulatory Visit: Payer: Self-pay | Admitting: Family Medicine

## 2019-08-18 ENCOUNTER — Other Ambulatory Visit: Payer: Self-pay | Admitting: "Endocrinology

## 2019-09-03 ENCOUNTER — Other Ambulatory Visit: Payer: Self-pay | Admitting: Family Medicine

## 2019-09-09 ENCOUNTER — Encounter: Payer: Self-pay | Admitting: Family Medicine

## 2019-09-21 ENCOUNTER — Other Ambulatory Visit: Payer: Self-pay | Admitting: Family Medicine

## 2019-09-30 ENCOUNTER — Other Ambulatory Visit: Payer: Self-pay

## 2019-09-30 ENCOUNTER — Encounter: Payer: Self-pay | Admitting: Nutrition

## 2019-09-30 ENCOUNTER — Encounter: Payer: Medicare Other | Attending: Family Medicine | Admitting: Nutrition

## 2019-09-30 ENCOUNTER — Encounter: Payer: Self-pay | Admitting: "Endocrinology

## 2019-09-30 ENCOUNTER — Ambulatory Visit (INDEPENDENT_AMBULATORY_CARE_PROVIDER_SITE_OTHER): Payer: Medicare Other | Admitting: "Endocrinology

## 2019-09-30 VITALS — BP 160/98 | HR 102 | Ht 69.0 in | Wt 287.4 lb

## 2019-09-30 DIAGNOSIS — I10 Essential (primary) hypertension: Secondary | ICD-10-CM

## 2019-09-30 DIAGNOSIS — E119 Type 2 diabetes mellitus without complications: Secondary | ICD-10-CM | POA: Insufficient documentation

## 2019-09-30 DIAGNOSIS — Z6841 Body Mass Index (BMI) 40.0 and over, adult: Secondary | ICD-10-CM

## 2019-09-30 DIAGNOSIS — E669 Obesity, unspecified: Secondary | ICD-10-CM

## 2019-09-30 DIAGNOSIS — E782 Mixed hyperlipidemia: Secondary | ICD-10-CM

## 2019-09-30 LAB — POCT GLYCOSYLATED HEMOGLOBIN (HGB A1C): Hemoglobin A1C: 6.4 % — AB (ref 4.0–5.6)

## 2019-09-30 MED ORDER — CLONIDINE HCL 0.1 MG PO TABS: 0.1000 mg | ORAL_TABLET | Freq: Two times a day (BID) | ORAL | 3 refills | Status: DC

## 2019-09-30 NOTE — Progress Notes (Signed)
09/30/2019  Endocrinology follow-up note  Subjective:    Patient ID: Karen Rios, female    DOB: 10/18/1955, PCP Corum, Rex Kras, MD   Past Medical History:  Diagnosis Date  . Arthritis   . Diabetes mellitus without complication ([place])   . Hypertension    Past Surgical History:  Procedure Laterality Date  . TUBAL LIGATION     Social History   Socioeconomic History  . Marital status: Widowed    Spouse name: Not on file  . Number of children: Not on file  . Years of education: Not on file  . Highest education level: Not on file  Occupational History  . Occupation: retired  Tobacco Use  . Smoking status: Never Smoker  . Smokeless tobacco: Never Used  Substance and Sexual Activity  . Alcohol use: No  . Drug use: No  . Sexual activity: Not on file  Other Topics Concern  . Not on file  Social History Narrative  . Not on file   Social Determinants of Health   Financial Resource Strain:   . Difficulty of Paying Living Expenses:   Food Insecurity:   . Worried About Charity fundraiser in the Last Year:   . Arboriculturist in the Last Year:   Transportation Needs:   . Film/video editor (Medical):   Marland Kitchen Lack of Transportation (Non-Medical):   Physical Activity:   . Days of Exercise per Week:   . Minutes of Exercise per Session:   Stress:   . Feeling of Stress :   Social Connections:   . Frequency of Communication with Friends and Family:   . Frequency of Social Gatherings with Friends and Family:   . Attends Religious Services:   . Active Member of Clubs or Organizations:   . Attends Archivist Meetings:   Marland Kitchen Marital Status:    Outpatient Encounter Medications as of 09/30/2019  Medication Sig  . ACCU-CHEK AVIVA PLUS test strip USE TO TEST 4 TIMES DAILY.  Marland Kitchen Ascorbic Acid (VITAMIN C) 1000 MG tablet Take 1,000 mg by mouth daily.  Marland Kitchen aspirin EC 81 MG tablet Take 81 mg by mouth daily.  . Blood Glucose Monitoring Suppl (ACCU-CHEK AVIVA PLUS) w/Device KIT  Test BG 4 x daily. e11.65  . [place]-Glucosamine-Vit D (OSTEO BI-FLEX ONE PER DAY PO) Take 2 tablets by mouth daily.  . Cholecalciferol (VITAMIN D) 125 MCG (5000 UT) CAPS Take 5,000 Units by mouth daily.  . cloNIDine (CATAPRES) 0.1 MG tablet Take 1 tablet (0.1 mg total) by mouth 2 (two) times daily.  . hydrochlorothiazide (HYDRODIURIL) 25 MG tablet TAKE 1 TABLET BY MOUTH ONCE DAILY.  Marland Kitchen losartan (COZAAR) 100 MG tablet TAKE ONE TABLET BY MOUTH ONCE DAILY.  . metFORMIN (GLUCOPHAGE) 500 MG tablet TAKE (1) TABLET BY MOUTH TWICE DAILY WITH A MEAL.  . Multiple Vitamins-Minerals (CENTRUM SILVER ULTRA WOMENS) TABS Take 1 tablet by mouth every morning.  . simvastatin (ZOCOR) 20 MG tablet TAKE 1 TABLET BY MOUTH ONCE DAILY.  . [DISCONTINUED] cloNIDine (CATAPRES) 0.3 MG tablet TAKE (1) TABLET BY MOUTH 3 TIMES DAILY.   No facility-administered encounter medications on file as of 09/30/2019.   ALLERGIES: No Known Allergies VACCINATION STATUS:  There is no immunization history on file for this patient.  Diabetes She presents for her follow-up diabetic visit. She has type 2 diabetes mellitus. Onset time: She was diagnosed at approximate age of 14 years. Her disease course has been stable (She has improved her A1c  from 18.8% to 5.7% !). There are no hypoglycemic associated symptoms. Pertinent negatives for hypoglycemia include no confusion, headaches, pallor or seizures. There are no diabetic associated symptoms. Pertinent negatives for diabetes include no chest pain, no polydipsia, no polyphagia and no polyuria. There are no hypoglycemic complications. Symptoms are stable. There are no diabetic complications. Risk factors for coronary artery disease include diabetes mellitus, dyslipidemia, hypertension, obesity, sedentary lifestyle and post-menopausal. Current diabetic treatment includes oral agent (monotherapy). She is compliant with treatment most of the time. Her weight is fluctuating minimally. She is  following a diabetic diet. When asked about meal planning, she reported none. She has had a previous visit with a dietitian. She participates in exercise intermittently. An ACE inhibitor/angiotensin II receptor blocker is being taken.  Hyperlipidemia This is a chronic problem. The current episode started more than 1 year ago. The problem is controlled. Exacerbating diseases include diabetes. Pertinent negatives include no chest pain, focal sensory loss, myalgias or shortness of breath. Current antihyperlipidemic treatment includes statins.  Hypertension This is a chronic problem. The current episode started more than 1 year ago. The problem is uncontrolled. Pertinent negatives include no chest pain, headaches, palpitations or shortness of breath. Risk factors for coronary artery disease include diabetes mellitus, obesity, sedentary lifestyle and post-menopausal state. Past treatments include ACE inhibitors and diuretics. The current treatment provides mild improvement. Compliance problems include medication cost.     Review of systems  Constitutional: + Minimally fluctuating body weight,  current  Body mass index is 42.44 kg/m. , no fatigue, no subjective hyperthermia, no subjective hypothermia Eyes: no blurry vision, no xerophthalmia ENT: no sore throat, no nodules palpated in throat, no dysphagia/odynophagia, no hoarseness Cardiovascular: no Chest Pain, no Shortness of Breath, no palpitations, no leg swelling Respiratory: no cough, no shortness of breath Gastrointestinal: no Nausea/Vomiting/Diarhhea Musculoskeletal: no muscle/joint aches Skin: no rashes, no hyperemia Neurological: no tremors, no numbness, no tingling, no dizziness Psychiatric: no depression, no anxiety   Objective:    BP (!) 160/98   Pulse (!) 102   Ht '5\' 9"'  (1.753 m)   Wt 287 lb 6.4 oz (130.4 kg)   BMI 42.44 kg/m   Wt Readings from Last 3 Encounters:  09/30/19 287 lb 6.4 oz (130.4 kg)  08/16/19 292 lb (132.5 kg)   07/14/19 292 lb 6.4 oz (132.6 kg)      Physical Exam- Limited  Constitutional:  Body mass index is 42.44 kg/m. , not in acute distress, normal state of mind Eyes:  EOMI, no exophthalmos Neck: Supple Thyroid: No gross goiter Respiratory: Adequate breathing efforts Musculoskeletal: no gross deformities, strength intact in all four extremities, no gross restriction of joint movements Skin:  no rashes, no hyperemia Neurological: no tremor with outstretched hands,   CMP     Component Value Date/Time   NA 142 03/23/2019 0734   K 4.1 03/23/2019 0734   CL 105 03/23/2019 0734   CO2 25 03/23/2019 0734   GLUCOSE 123 (H) 03/23/2019 0734   BUN 15 03/23/2019 0734   CREATININE 0.81 03/23/2019 0734   CALCIUM 9.6 03/23/2019 0734   PROT 7.1 03/23/2019 0734   ALBUMIN 4.1 11/14/2016 0720   AST 15 03/23/2019 0734   ALT 15 03/23/2019 0734   ALKPHOS 66 11/14/2016 0720   BILITOT 0.4 03/23/2019 0734   GFRNONAA 77 03/23/2019 0734   GFRAA 89 03/23/2019 0734     Diabetic Labs (most recent): Lab Results  Component Value Date   HGBA1C 6.4 (A) 09/30/2019  HGBA1C 6.5 (H) 03/23/2019   HGBA1C 5.7 (H) 09/14/2018      Assessment & Plan:   1. Uncontrolled type 2 diabetes mellitus without complication, without long-term current use of insulin ([place])  -She  remains at a high risk for more acute and chronic complications of diabetes which include CAD, CVA, CKD, retinopathy, and neuropathy. These are all discussed in detail with the patient.  -Her point-of-care A1c is 6.4%, remaining stable.   -She denies hypoglycemia. -  Recent labs reviewed.  - I have re-counseled the patient on diet managemweight loss   by adopting a carbohydrate restricted / protein rich  Diet.  - she  admits there is a room for improvement in her diet and drink choices. -  Suggestion is made for her to avoid simple carbohydrates  from her diet including Cakes, Sweet Desserts / Pastries, Ice Cream, Soda (diet and regular),  Sweet Tea, Candies, Chips, Cookies, Sweet Pastries,  Store Bought Juices, Alcohol in Excess of  1-2 drinks a day, Artificial Sweeteners, Coffee Creamer, and "Sugar-free" Products. This will help patient to have stable blood glucose profile and potentially avoid unintended weight gain.  - Patient is advised to stick to a routine mealtimes to eat 3 meals  a day and avoid unnecessary snacks ( to snack only to correct hypoglycemia).   - I have approached patient with the following individualized plan to manage diabetes and patient agrees.  -She is advised to crease metformin to 500 mg p.o. twice daily.    - Patient will be considered for incretin therapy  If she loses control .  - Patient specific target  for A1c; LDL, HDL, Triglycerides, and  Waist Circumference were discussed in detail.  2) BP/HTN: Her blood pressures not controlled to target. -She did not pick up her Klonopin prescription.  I discussed and prescribed clonidine 0.1 mg p.o. twice daily, advised to continue benazepril 40 mg p.o. daily, and hydrochlorothiazide 25 mg p.o. daily.    3) Lipids/HPL: Her recent lipid panel showed LDL of 80.  She is advised to continue simvastatin 20 mg p.o. nightly .   Side effects and precautions discussed with her.   4)  Weight/Diet: Her BMI is 40.9-BDZHGDJ complicating her diabetes care.  She is a candidate for modest weight loss.  CDE consult in progress, exercise, and carbohydrates information provided.  5) Chronic Care/Health Maintenance:  -Patient is on ACEI/ARB and Statin medications and encouraged to continue to follow up with Ophthalmology, Podiatrist at least yearly or according to recommendations, and advised to  stay away from smoking. I have recommended yearly flu vaccine and pneumonia vaccination at least every 5 years; moderate intensity exercise for up to 150 minutes weekly; and  sleep for at least 7 hours a day.  - I advised patient to maintain close follow up with Maryruth Hancock, MD  for primary care needs.  - Time spent on this patient care encounter:  35 min, of which > 50% was spent in  counseling and the rest reviewing her blood glucose logs , discussing her hypoglycemia and hyperglycemia episodes, reviewing her current and  previous labs / studies  ( including abstraction from other facilities) and medications  doses and developing a  long term treatment plan and documenting her care.   Please refer to Patient Instructions for Blood Glucose Monitoring and Insulin/Medications Dosing Guide"  in media tab for additional information. Please  also refer to " Patient Self Inventory" in the Media  tab for reviewed elements  of pertinent patient history.  Karen Rios participated in the discussions, expressed understanding, and voiced agreement with the above plans.  All questions were answered to her satisfaction. she is encouraged to contact clinic should she have any questions or concerns prior to her return visit.    Follow up plan: -Return in about 6 months (around 04/01/2020) for Follow up with Pre-visit Labs.  Glade Lloyd, MD Phone: [phone]  Fax: [phone]   -  This note was partially dictated with voice recognition software. Similar sounding words can be transcribed inadequately or may not  be corrected upon review.  09/30/2019, 5:31 PM

## 2019-09-30 NOTE — Patient Instructions (Signed)

## 2019-09-30 NOTE — Progress Notes (Addendum)
   Medical Nutrition Therapy:  Appt start time: 1000  end time:  1015 Assessment:  Assessment:  Primary concerns today: Diabetes Type 2. .  and lost 5 lbs.  Saw Dr. Fransico Him, Endocrinology today. Metformin 500 mg a day.   Has a bike and treadmill about an hour a day. Drinking water. Feels great. Wants to get back to [facility] for water aerobics.   Lab Results  Component Value Date   HGBA1C 6.4 (A) 09/30/2019   CMP Latest Ref Rng & Units 03/23/2019 09/14/2018 03/17/2018  Glucose 65 - 99 mg/dL 517(O) 160(V) 371(G)  BUN 7 - 25 mg/dL 15 12 12   Creatinine 0.50 - 0.99 mg/dL 6.26 9.48  Sodium 135 - 146 mmol/L 142 139 141  Potassium 3.5 - 5.3 mmol/L 4.1 3.9 4.0  Chloride 98 - 110 mmol/L 105 102 104  CO2 20 - 32 mmol/L 25 29 29   Calcium 8.6 - 10.4 mg/dL 9.6 9.7 9.7  Total Protein 6.1 - 8.1 g/dL 7.1 7.3 7.1  Total Bilirubin 0.2 - 1.2 mg/dL 0.4 0.7 0.4  Alkaline Phos 33 - 130 U/L - - -  AST 10 - 35 U/L 15 16 15   ALT 6 - 29 U/L 15 11 14     Vitals with BMI 03/26/2018  Height 5\' 9"   Weight 287 lbs  BMI 42.36  Systolic 166  Diastolic 94  Pulse 92  Respirations     Preferred Learning Style:     No preference indicated   Learning Readiness:   Ready  Change in progress  MEDICATIONS: see list   DIETARY INTAKE:  24-hr recall:  B ( AM): Oatmeal boiled egg and fruit, water  Snk ( AM): none L ( PM):Tuna salad D ( PM): Toss salad and chicken, and fruit, water    Usual physical activity:  Goinng to [facility] for water aerobics 4 times per week and walking in the pool  Estimated energy needs: 1800 calories 200 g carbohydrates 135 g protein 50 g fat  Progress Towards Goal(s):  In progress.   Nutritional Diagnosis:  NB-1.1 Food and nutrition-related knowledge deficit As related to Diabetes.  As evidenced by A1C that was 18% and now down to 5.5% .    Intervention:  Nutrition counseling and diabetes education on meal planning with My Plate, carb counting, portion sizes, weight loss tips,  benefits of exercise and prevention of complications from DM.  Weight loss tips and increasing physical activity. Avoiding salty processed foods.  Goals 1 Lose 6-10 lbs in the next 6 months 2. Keep up the great job! Keep eating fresh fruits and vegetables.   Teaching Method Utilized:Visual Auditory Hands on  Handouts given during visit include: The Plate Method Meal Plan Card  Barriers to learning/adherence to lifestyle change: none  Demonstrated degree of understanding via:  Teach Back   Monitoring/Evaluation:  Dietary intake, exercise, meal planning, and body weight in 6 month(s) to continue to work on weight loss. 

## 2019-09-30 NOTE — Patient Instructions (Signed)
Goals 1 Lose 6-10 lbs in the next 6 months 2. Keep up the great job! Keep eating fresh fruits and vegetables.

## 2019-10-21 ENCOUNTER — Other Ambulatory Visit: Payer: Self-pay | Admitting: Family Medicine

## 2019-10-21 ENCOUNTER — Other Ambulatory Visit: Payer: Self-pay | Admitting: "Endocrinology

## 2019-11-18 ENCOUNTER — Other Ambulatory Visit: Payer: Self-pay | Admitting: "Endocrinology

## 2019-11-19 ENCOUNTER — Other Ambulatory Visit: Payer: Self-pay | Admitting: Family Medicine

## 2019-12-02 DIAGNOSIS — L818 Other specified disorders of pigmentation: Secondary | ICD-10-CM | POA: Diagnosis not present

## 2019-12-23 ENCOUNTER — Other Ambulatory Visit: Payer: Self-pay | Admitting: Family Medicine

## 2020-01-05 ENCOUNTER — Telehealth: Payer: Self-pay | Admitting: "Endocrinology

## 2020-01-05 ENCOUNTER — Other Ambulatory Visit: Payer: Self-pay

## 2020-01-05 DIAGNOSIS — E119 Type 2 diabetes mellitus without complications: Secondary | ICD-10-CM

## 2020-01-05 MED ORDER — ACCU-CHEK AVIVA PLUS W/DEVICE KIT: PACK | 0 refills | Status: DC

## 2020-01-05 MED ORDER — ACCU-CHEK AVIVA PLUS VI STRP: ORAL_STRIP | 3 refills | Status: DC

## 2020-01-05 NOTE — Telephone Encounter (Signed)
Pt said her meter has given out. She would like a new one to be called into [place]. Accu Chek Aviva with strips. Thanks

## 2020-01-05 NOTE — Telephone Encounter (Signed)
Sent in

## 2020-01-07 DIAGNOSIS — E559 Vitamin D deficiency, unspecified: Secondary | ICD-10-CM | POA: Diagnosis not present

## 2020-01-07 DIAGNOSIS — E119 Type 2 diabetes mellitus without complications: Secondary | ICD-10-CM | POA: Diagnosis not present

## 2020-01-07 DIAGNOSIS — I10 Essential (primary) hypertension: Secondary | ICD-10-CM | POA: Diagnosis not present

## 2020-01-14 DIAGNOSIS — Z789 Other specified health status: Secondary | ICD-10-CM | POA: Diagnosis not present

## 2020-01-14 DIAGNOSIS — Z7189 Other specified counseling: Secondary | ICD-10-CM | POA: Diagnosis not present

## 2020-01-14 DIAGNOSIS — I10 Essential (primary) hypertension: Secondary | ICD-10-CM | POA: Diagnosis not present

## 2020-01-18 ENCOUNTER — Other Ambulatory Visit: Payer: Self-pay | Admitting: "Endocrinology

## 2020-01-19 ENCOUNTER — Other Ambulatory Visit: Payer: Self-pay | Admitting: "Endocrinology

## 2020-01-19 ENCOUNTER — Other Ambulatory Visit: Payer: Self-pay | Admitting: Family Medicine

## 2020-03-20 ENCOUNTER — Other Ambulatory Visit: Payer: Self-pay | Admitting: "Endocrinology

## 2020-03-24 ENCOUNTER — Other Ambulatory Visit: Payer: Self-pay | Admitting: "Endocrinology

## 2020-03-27 DIAGNOSIS — E119 Type 2 diabetes mellitus without complications: Secondary | ICD-10-CM | POA: Diagnosis not present

## 2020-03-27 DIAGNOSIS — E782 Mixed hyperlipidemia: Secondary | ICD-10-CM | POA: Diagnosis not present

## 2020-03-28 LAB — LIPID PANEL
Cholesterol: 145 mg/dL (ref <200)
HDL: 45 mg/dL — ABNORMAL LOW (ref >=50)
LDL Cholesterol (Calc): 71 mg/dL (calc)
Non-HDL Cholesterol (Calc): 100 mg/dL (calc) (ref <130)
Total CHOL/HDL Ratio: 3.2 (calc) (ref <5.0)
Triglycerides: 194 mg/dL — ABNORMAL HIGH (ref <150)

## 2020-03-28 LAB — COMPLETE METABOLIC PANEL WITH GFR
AG Ratio: 1.5 (calc) (ref 1.0–2.5)
ALT: 12 U/L (ref 6–29)
AST: 15 U/L (ref 10–35)
Albumin: 4.3 g/dL (ref 3.6–5.1)
Alkaline phosphatase (APISO): 80 U/L (ref 37–153)
BUN: 13 mg/dL (ref 7–25)
CO2: 25 mmol/L (ref 20–32)
Calcium: 9.4 mg/dL (ref 8.6–10.4)
Chloride: 107 mmol/L (ref 98–110)
Creat: 0.76 mg/dL (ref 0.50–0.99)
GFR, Est African American: 95 mL/min/{1.73_m2} (ref >=60)
GFR, Est Non African American: 82 mL/min/{1.73_m2} (ref >=60)
Globulin: 2.9 g/dL (calc) (ref 1.9–3.7)
Glucose, Bld: 112 mg/dL — ABNORMAL HIGH (ref 65–99)
Potassium: 3.9 mmol/L (ref 3.5–5.3)
Sodium: 142 mmol/L (ref 135–146)
Total Bilirubin: 0.4 mg/dL (ref 0.2–1.2)
Total Protein: 7.2 g/dL (ref 6.1–8.1)

## 2020-03-28 LAB — MICROALBUMIN / CREATININE URINE RATIO
Creatinine, Urine: 143 mg/dL (ref 20–275)
Microalb Creat Ratio: 409 mcg/mg creat — ABNORMAL HIGH (ref <30)
Microalb, Ur: 58.5 mg/dL

## 2020-03-28 LAB — T4, FREE: Free T4: 1.4 ng/dL (ref 0.8–1.8)

## 2020-03-28 LAB — TSH: TSH: 1.45 mIU/L (ref 0.40–4.50)

## 2020-04-04 ENCOUNTER — Encounter: Payer: Medicare Other | Attending: "Endocrinology | Admitting: Nutrition

## 2020-04-04 ENCOUNTER — Encounter: Payer: Self-pay | Admitting: Nurse Practitioner

## 2020-04-04 ENCOUNTER — Encounter: Payer: Self-pay | Admitting: Nutrition

## 2020-04-04 ENCOUNTER — Telehealth (INDEPENDENT_AMBULATORY_CARE_PROVIDER_SITE_OTHER): Payer: Medicare Other | Admitting: Nurse Practitioner

## 2020-04-04 ENCOUNTER — Other Ambulatory Visit: Payer: Self-pay

## 2020-04-04 VITALS — BP 123/81 | Ht 69.0 in | Wt 270.0 lb

## 2020-04-04 VITALS — Ht 69.0 in | Wt 270.0 lb

## 2020-04-04 DIAGNOSIS — E782 Mixed hyperlipidemia: Secondary | ICD-10-CM

## 2020-04-04 DIAGNOSIS — E669 Obesity, unspecified: Secondary | ICD-10-CM | POA: Diagnosis present

## 2020-04-04 DIAGNOSIS — E119 Type 2 diabetes mellitus without complications: Secondary | ICD-10-CM | POA: Diagnosis not present

## 2020-04-04 DIAGNOSIS — I10 Essential (primary) hypertension: Secondary | ICD-10-CM | POA: Diagnosis not present

## 2020-04-04 NOTE — Progress Notes (Signed)
04/04/2020  Endocrinology follow-up note   TELEHEALTH VISIT: The patient is being engaged in telehealth visit due to COVID-19.  This type of visit limits physical examination significantly, and thus is not preferable over face-to-face encounters.  I connected with  Karen Rios on 04/04/20 by a video enabled telemedicine application and verified that I am speaking with the correct person using two identifiers.   I discussed the limitations of evaluation and management by telemedicine. The patient expressed understanding and agreed to proceed.    The participants involved in this visit include: Brita Romp, NP located at [facility] and Karen Rios  located at their personal residence listed.   Subjective:    Patient ID: Karen Rios, female    DOB: Sep 29, 1955, PCP Corum, Rex Kras, MD   Past Medical History:  Diagnosis Date   Arthritis    Diabetes mellitus without complication ([place])    Hypertension    Past Surgical History:  Procedure Laterality Date   TUBAL LIGATION     Social History   Socioeconomic History   Marital status: Widowed    Spouse name: Not on file   Number of children: Not on file   Years of education: Not on file   Highest education level: Not on file  Occupational History   Occupation: retired  Tobacco Use   Smoking status: Never Smoker   Smokeless tobacco: Never Used  Scientific laboratory technician Use: Never used  Substance and Sexual Activity   Alcohol use: No   Drug use: No   Sexual activity: Not on file  Other Topics Concern   Not on file  Social History Narrative   Not on file   Social Determinants of Health   Financial Resource Strain:    Difficulty of Paying Living Expenses: Not on file  Food Insecurity:    Worried About Charity fundraiser in the Last Year: Not on file   [place] in the Last Year: Not on file  Transportation Needs:    Lack of Transportation (Medical): Not on  file   Lack of Transportation (Non-Medical): Not on file  Physical Activity:    Days of Exercise per Week: Not on file   Minutes of Exercise per Session: Not on file  Stress:    Feeling of Stress : Not on file  Social Connections:    Frequency of Communication with Friends and Family: Not on file   Frequency of Social Gatherings with Friends and Family: Not on file   Attends Religious Services: Not on file   Active Member of Clubs or Organizations: Not on file   Attends Club or Organization Meetings: Not on file   Marital Status: Not on file   Outpatient Encounter Medications as of 04/04/2020  Medication Sig   Accu-Chek Softclix Lancets lancets 4 (four) times daily.   Ascorbic Acid (VITAMIN C) 1000 MG tablet Take 1,000 mg by mouth daily.   aspirin EC 81 MG tablet Take 81 mg by mouth daily.   Blood Glucose Monitoring Suppl (ACCU-CHEK AVIVA PLUS) w/Device KIT Test BG 4 x daily. e11.65   Boswellia-Glucosamine-Vit D (OSTEO BI-FLEX ONE PER DAY PO) Take 2 tablets by mouth daily.   Cholecalciferol (VITAMIN D) 125 MCG (5000 UT) CAPS Take 5,000 Units by mouth daily.   cloNIDine (CATAPRES) 0.1 MG tablet TAKE ONE TABLET BY MOUTH 2 TIMES A DAY   glucose blood (ACCU-CHEK AVIVA PLUS) test strip USE TO TEST 4 TIMES DAILY.  hydrochlorothiazide (HYDRODIURIL) 25 MG tablet TAKE 1 TABLET BY MOUTH ONCE DAILY.   hydroquinone 4 % cream APPLY TO DARK SPOTS 2CTIMES A DAY AS NEEDED   losartan (COZAAR) 100 MG tablet TAKE ONE TABLET BY MOUTH ONCE DAILY.   metFORMIN (GLUCOPHAGE) 500 MG tablet TAKE (1) TABLET BY MOUTH TWICE DAILY WITH A MEAL.   Multiple Vitamins-Minerals (CENTRUM SILVER ULTRA WOMENS) TABS Take 1 tablet by mouth every morning.   simvastatin (ZOCOR) 20 MG tablet TAKE 1 TABLET BY MOUTH ONCE DAILY.   No facility-administered encounter medications on file as of 04/04/2020.   ALLERGIES: No Known Allergies VACCINATION STATUS:  There is no immunization history on file for this  patient.  Diabetes She presents for her follow-up diabetic visit. She has type 2 diabetes mellitus. Onset time: She was diagnosed at approximate age of 25 years. Her disease course has been stable (She has improved her A1c from 18.8% to 5.7% !). There are no hypoglycemic associated symptoms. Pertinent negatives for hypoglycemia include no confusion, headaches, pallor or seizures. There are no diabetic associated symptoms. Pertinent negatives for diabetes include no chest pain, no polydipsia, no polyphagia and no polyuria. There are no hypoglycemic complications. Symptoms are stable. There are no diabetic complications. Risk factors for coronary artery disease include diabetes mellitus, dyslipidemia, hypertension, obesity, sedentary lifestyle and post-menopausal. Current diabetic treatment includes oral agent (monotherapy). She is compliant with treatment most of the time. Her weight is fluctuating minimally. She is following a diabetic diet. When asked about meal planning, she reported none. She has had a previous visit with a dietitian. She participates in exercise intermittently. Her home blood glucose trend is fluctuating minimally. (She presents for her virtual visit today with her logs showing at target fasting glycemic profile with readings ranging from 100-120.  She denies any episodes of hypoglycemia.  Her POCT A1C from last visit was 6.4%, not checked prior to this appt.  She has changed her lifestyle and has lost weight.) An ACE inhibitor/angiotensin II receptor blocker is being taken.  Hyperlipidemia This is a chronic problem. The current episode started more than 1 year ago. The problem is controlled. Exacerbating diseases include diabetes. Pertinent negatives include no chest pain, focal sensory loss, myalgias or shortness of breath. Current antihyperlipidemic treatment includes statins.  Hypertension This is a chronic problem. The current episode started more than 1 year ago. The problem is  uncontrolled. Pertinent negatives include no chest pain, headaches, palpitations or shortness of breath. Risk factors for coronary artery disease include diabetes mellitus, obesity, sedentary lifestyle and post-menopausal state. Past treatments include ACE inhibitors and diuretics. The current treatment provides mild improvement. Compliance problems include medication cost.     Review of systems  Constitutional: + weight loss,  current  Body mass index is 39.87 kg/m. , no fatigue, no subjective hyperthermia, no subjective hypothermia Eyes: no blurry vision, no xerophthalmia ENT: no sore throat, no nodules palpated in throat, no dysphagia/odynophagia, no hoarseness Cardiovascular: no Chest Pain, no Shortness of Breath, no palpitations, no leg swelling Respiratory: no cough, no shortness of breath Gastrointestinal: no Nausea/Vomiting/Diarhhea Musculoskeletal: no muscle/joint aches Skin: no rashes, no hyperemia Neurological: no tremors, no numbness, no tingling, no dizziness Psychiatric: no depression, no anxiety   Objective:    BP 123/81 Comment: pt reported   Ht _0  (1.753 m)    Wt 270 lb (122.5 kg)    BMI 39.87 kg/m   Wt Readings from Last 3 Encounters:  04/04/20 270 lb (122.5 kg)  09/30/19 287  lb 6.4 oz (130.4 kg)  08/16/19 292 lb (132.5 kg)     BP Readings from Last 3 Encounters:  04/04/20 123/81  09/30/19 (!) 160/98  08/16/19 (!) 160/70   Physical Exam- Telehealth- significantly limited due to nature of visit  Constitutional: Body mass index is 39.87 kg/m. , not in acute distress, normal state of mind Respiratory: Adequate breathing efforts  CMP     Component Value Date/Time   NA 142 03/27/2020 0801   K 3.9 03/27/2020 0801   CL 107 03/27/2020 0801   CO2 25 03/27/2020 0801   GLUCOSE 112 (H) 03/27/2020 0801   BUN 13 03/27/2020 0801   CREATININE 0.76 03/27/2020 0801   CALCIUM 9.4 03/27/2020 0801   PROT 7.2 03/27/2020 0801   ALBUMIN 4.1 11/14/2016 0720   AST 15  03/27/2020 0801   ALT 12 03/27/2020 0801   ALKPHOS 66 11/14/2016 0720   BILITOT 0.4 03/27/2020 0801   GFRNONAA 82 03/27/2020 0801   GFRAA 95 03/27/2020 0801     Diabetic Labs (most recent): Lab Results  Component Value Date   HGBA1C 6.4 (A) 09/30/2019   HGBA1C 6.5 (H) 03/23/2019   HGBA1C 5.7 (H) 09/14/2018      Assessment & Plan:   1. Uncontrolled type 2 diabetes mellitus without complication, without long-term current use of insulin ([place])  -She  remains at a high risk for more acute and chronic complications of diabetes which include CAD, CVA, CKD, retinopathy, and neuropathy. These are all discussed in detail with the patient.  She presents for her virtual visit today with her logs showing at target fasting glycemic profile with readings ranging from 100-120.  She denies any episodes of hypoglycemia.  Her POCT A1C from last visit was 6.4%, not checked prior to this appt.  She has changed her lifestyle and has lost weight.  -  Recent labs reviewed.  - The patient admits there is a room for improvement in their diet and drink choices. -  Suggestion is made for the patient to avoid simple carbohydrates from their diet including Cakes, Sweet Desserts / Pastries, Ice Cream, Soda (diet and regular), Sweet Tea, Candies, Chips, Cookies, Sweet Pastries,  Store Bought Juices, Alcohol in Excess of  1-2 drinks a day, Artificial Sweeteners, Coffee Creamer, and "Sugar-free" Products. This will help patient to have stable blood glucose profile and potentially avoid unintended weight gain.   - I encouraged the patient to switch to  unprocessed or minimally processed complex starch and increased protein intake (animal or plant source), fruits, and vegetables.   - Patient is advised to stick to a routine mealtimes to eat 3 meals  a day and avoid unnecessary snacks ( to snack only to correct hypoglycemia).   - I have approached patient with the following individualized plan to manage diabetes and  patient agrees.  -She is advised to continue Metformin 500 mg po twice daily with meals.  - Patient will be considered for incretin therapy  If she loses control .  - Patient specific target  for A1c; LDL, HDL, Triglycerides, and  Waist Circumference were discussed in detail.  2) BP/HTN:  Her previous visit BP reading was uncontrolled.  She does monitor her BP at home and reports normal BP ranges.  She is advised to continue Clonidine 0.1 mg po twice daily, HCTZ 25 mg po daily, and Cozaar 100 mg po daily.   3) Lipids/HPL:  Her most recent lipid panel from 03/27/20 shows controlled LDL of 71 and elevated  triglycerides of 194.  She is advised to continue Simvastatin 20 mg po daily at bedtime.  She is advised to avoid fried foods or foods prepared with butter.  4)  Weight/Diet:  Her Body mass index is 39.87 kg/m.-- clearly complicating her diabetes care.  She is a candidate for modest weight loss.  CDE consult in progress, exercise, and carbohydrates information provided.  5) Chronic Care/Health Maintenance: -Patient is on ACEI/ARB and Statin medications and encouraged to continue to follow up with Ophthalmology, Podiatrist at least yearly or according to recommendations, and advised to  stay away from smoking. I have recommended yearly flu vaccine and pneumonia vaccination at least every 5 years; moderate intensity exercise for up to 150 minutes weekly; and  sleep for at least 7 hours a day.  - I advised patient to maintain close follow up with Maryruth Hancock, MD for primary care needs.  I spent 30 minutes dedicated to the care of this patient on the date of this encounter to include pre-visit review of records, face-to-face time with the patient, and post visit ordering of  testing.   Please refer to Patient Instructions for Blood Glucose Monitoring and Insulin/Medications Dosing Guide"  in media tab for additional information. Please  also refer to " Patient Self Inventory" in the Media  tab  for reviewed elements of pertinent patient history.  Karen Rios participated in the discussions, expressed understanding, and voiced agreement with the above plans.  All questions were answered to her satisfaction. she is encouraged to contact clinic should she have any questions or concerns prior to her return visit.    Follow up plan: -Return in about 6 months (around 10/02/2020) for Diabetes follow up, Previsit labs, Virtual visit ok.  Rayetta Pigg, [facility] [address] [place], [place] [address] Phone: [phone] Fax: [phone]  -  This note was partially dictated with voice recognition software. Similar sounding words can be transcribed inadequately or may not  be corrected upon review.  04/04/2020, 10:01 AM

## 2020-04-04 NOTE — Patient Instructions (Signed)
Goals  Increase high fiber foods to help lower your triglycerides. Keep up the great job!!! Stay active with swimming at the [facility] 4 times per week.

## 2020-04-04 NOTE — Patient Instructions (Signed)

## 2020-04-04 NOTE — Progress Notes (Signed)
   Medical Nutrition Therapy:  Appt start time: 1045  end time: 1100 Assessment:  Assessment:  Primary concerns today: Diabetes Type 2. .  She has lost 17 lbs in the last 3 months.  Saw Dr. Fransico Him, Endocrinology today. Metformin 500 mg BID.   Eating meals on time. Avoiding snacks. Doing very well. Drinking water. Feels great. Wants to get back to [facility] for water aerobics.   Lab Results  Component Value Date   HGBA1C 6.4 (A) 09/30/2019   CMP Latest Ref Rng & Units 03/27/2020 03/23/2019 09/14/2018  Glucose 65 - 99 mg/dL 342(A) 768(T) 157(W)  BUN 7 - 25 mg/dL 13 15 12   Creatinine 0.50 - 0.99 mg/dL 6.20 3.55  Sodium 135 - 146 mmol/L 142 142 139  Potassium 3.5 - 5.3 mmol/L 3.9 4.1 3.9  Chloride 98 - 110 mmol/L 107 105 102  CO2 20 - 32 mmol/L 25 25 29   Calcium 8.6 - 10.4 mg/dL 9.4 9.6 9.7  Total Protein 6.1 - 8.1 g/dL 7.2 7.1 7.3  Total Bilirubin 0.2 - 1.2 mg/dL 0.4 0.4 0.7  Alkaline Phos 33 - 130 U/L - - -  AST 10 - 35 U/L 15 15 16   ALT 6 - 29 U/L 12 15 11     Vitals with BMI 03/26/2018  Height 5\' 9"   Weight 287 lbs  BMI 42.36  Systolic 166  Diastolic 94  Pulse 92  Respirations     Preferred Learning Style:     No preference indicated   Learning Readiness:   Ready  Change in progress  MEDICATIONS: see list   DIETARY INTAKE:  24-hr recall:  B ( AM): Oatmeal boiled egg and fruit, water  Snk ( AM): none L ( PM):tuna salad, water, fruit D ( PM):chicken, broccoli, baked potato,water  Usual physical activity:  Goinng to [facility] for water aerobics 4 times per week and walking in the pool  Estimated energy needs: 1800 calories 200 g carbohydrates 135 g protein 50 g fat  Progress Towards Goal(s):  In progress.   Nutritional Diagnosis:  NB-1.1 Food and nutrition-related knowledge deficit As related to Diabetes.  As evidenced by A1C that was 18% and now down to 5.5% .    Intervention:  Nutrition counseling and diabetes education on meal planning with My Plate, carb  counting, portion sizes, weight loss tips, benefits of exercise and prevention of complications from DM.  Weight loss tips and increasing physical activity. Avoiding salty processed foods.  .Goals  Increase high fiber foods to help lower your triglycerides. Keep up the great job!!! Stay active with swimming at the [facility] 4 times per week.   Teaching Method Utilized:Visual Auditory Hands on  Handouts given during visit include: The Plate Method Meal Plan Card  Barriers to learning/adherence to lifestyle change: none  Demonstrated degree of understanding via:  Teach Back   Monitoring/Evaluation:  Dietary intake, exercise, meal planning, and body weight in 6 month(s) to continue to work on weight loss. 

## 2020-05-12 ENCOUNTER — Other Ambulatory Visit: Payer: Self-pay | Admitting: "Endocrinology

## 2020-06-15 ENCOUNTER — Other Ambulatory Visit: Payer: Self-pay | Admitting: "Endocrinology

## 2020-06-15 ENCOUNTER — Other Ambulatory Visit: Payer: Self-pay | Admitting: Nurse Practitioner

## 2020-07-18 ENCOUNTER — Other Ambulatory Visit: Payer: Self-pay | Admitting: Nurse Practitioner

## 2020-07-19 ENCOUNTER — Other Ambulatory Visit: Payer: Self-pay | Admitting: Nurse Practitioner

## 2020-07-19 ENCOUNTER — Other Ambulatory Visit: Payer: Self-pay | Admitting: "Endocrinology

## 2020-07-20 ENCOUNTER — Other Ambulatory Visit: Payer: Self-pay | Admitting: "Endocrinology

## 2020-08-17 ENCOUNTER — Other Ambulatory Visit: Payer: Self-pay | Admitting: Nurse Practitioner

## 2020-08-24 ENCOUNTER — Other Ambulatory Visit: Payer: Self-pay | Admitting: "Endocrinology

## 2020-08-24 DIAGNOSIS — E119 Type 2 diabetes mellitus without complications: Secondary | ICD-10-CM

## 2020-08-24 NOTE — Telephone Encounter (Signed)
Just wanted to clarify how often you would like pt to test her BG.

## 2020-09-25 DIAGNOSIS — E119 Type 2 diabetes mellitus without complications: Secondary | ICD-10-CM | POA: Diagnosis not present

## 2020-09-26 LAB — COMPREHENSIVE METABOLIC PANEL
ALT: 14 IU/L (ref 0–32)
AST: 15 IU/L (ref 0–40)
Albumin/Globulin Ratio: 1.5 (ref 1.2–2.2)
Albumin: 4.5 g/dL (ref 3.8–4.8)
Alkaline Phosphatase: 95 IU/L (ref 44–121)
BUN/Creatinine Ratio: 15 (ref 12–28)
BUN: 13 mg/dL (ref 8–27)
Bilirubin Total: 0.5 mg/dL (ref 0.0–1.2)
CO2: 25 mmol/L (ref 20–29)
Calcium: 9.8 mg/dL (ref 8.7–10.3)
Chloride: 103 mmol/L (ref 96–106)
Creatinine, Ser: 0.84 mg/dL (ref 0.57–1.00)
Globulin, Total: 3 g/dL (ref 1.5–4.5)
Glucose: 119 mg/dL — ABNORMAL HIGH (ref 65–99)
Potassium: 3.9 mmol/L (ref 3.5–5.2)
Sodium: 144 mmol/L (ref 134–144)
Total Protein: 7.5 g/dL (ref 6.0–8.5)
eGFR: 77 mL/min/{1.73_m2} (ref >59)

## 2020-09-26 LAB — HEMOGLOBIN A1C
Est. average glucose Bld gHb Est-mCnc: 134 mg/dL
Hgb A1c MFr Bld: 6.3 % — ABNORMAL HIGH (ref 4.8–5.6)

## 2020-09-28 ENCOUNTER — Other Ambulatory Visit: Payer: Self-pay

## 2020-09-28 DIAGNOSIS — E119 Type 2 diabetes mellitus without complications: Secondary | ICD-10-CM

## 2020-10-02 ENCOUNTER — Other Ambulatory Visit: Payer: Self-pay

## 2020-10-02 ENCOUNTER — Encounter: Payer: Self-pay | Admitting: Nutrition

## 2020-10-02 ENCOUNTER — Encounter: Payer: Medicare Other | Attending: Nurse Practitioner | Admitting: Nutrition

## 2020-10-02 ENCOUNTER — Ambulatory Visit: Payer: Medicare Other | Admitting: Nurse Practitioner

## 2020-10-02 ENCOUNTER — Encounter: Payer: Self-pay | Admitting: Nurse Practitioner

## 2020-10-02 VITALS — Ht 69.0 in | Wt 270.0 lb

## 2020-10-02 VITALS — BP 185/113 | HR 97 | Ht 69.0 in | Wt 273.2 lb

## 2020-10-02 DIAGNOSIS — E669 Obesity, unspecified: Secondary | ICD-10-CM | POA: Diagnosis present

## 2020-10-02 DIAGNOSIS — Z6841 Body Mass Index (BMI) 40.0 and over, adult: Secondary | ICD-10-CM | POA: Diagnosis present

## 2020-10-02 DIAGNOSIS — I10 Essential (primary) hypertension: Secondary | ICD-10-CM | POA: Insufficient documentation

## 2020-10-02 DIAGNOSIS — E559 Vitamin D deficiency, unspecified: Secondary | ICD-10-CM

## 2020-10-02 DIAGNOSIS — E782 Mixed hyperlipidemia: Secondary | ICD-10-CM | POA: Diagnosis not present

## 2020-10-02 DIAGNOSIS — E119 Type 2 diabetes mellitus without complications: Secondary | ICD-10-CM

## 2020-10-02 NOTE — Patient Instructions (Signed)

## 2020-10-02 NOTE — Progress Notes (Signed)
10/02/2020  Endocrinology follow-up note   TELEHEALTH VISIT: The patient is being engaged in telehealth visit due to COVID-19.  This type of visit limits physical examination significantly, and thus is not preferable over face-to-face encounters.  I connected with  Karen Rios on 10/02/20 by a video enabled telemedicine application and verified that I am speaking with the correct person using two identifiers.   I discussed the limitations of evaluation and management by telemedicine. The patient expressed understanding and agreed to proceed.    The participants involved in this visit include: Brita Romp, NP located at [facility] and Karen Rios.   Subjective:    Patient ID: Karen Rios, female    DOB: 10-06-1955, PCP Leslie Andrea, MD   Past Medical History:  Diagnosis Date  . Arthritis   . Diabetes mellitus without complication ([place])   . Hypertension    Past Surgical History:  Procedure Laterality Date  . TUBAL LIGATION     Social History   Socioeconomic History  . Marital status: Widowed    Spouse name: Not on file  . Number of children: Not on file  . Years of education: Not on file  . Highest education level: Not on file  Occupational History  . Occupation: retired  Tobacco Use  . Smoking status: Never Smoker  . Smokeless tobacco: Never Used  Vaping Use  . Vaping Use: Never used  Substance and Sexual Activity  . Alcohol use: No  . Drug use: No  . Sexual activity: Not on file  Other Topics Concern  . Not on file  Social History Narrative  . Not on file   Social Determinants of Health   Financial Resource Strain: Not on file  Food Insecurity: Not on file  Transportation Needs: Not on file  Physical Activity: Not on file  Stress: Not on file  Social Connections: Not on file   Outpatient Encounter Medications as of 10/02/2020  Medication Sig  . Accu-Chek  Softclix Lancets lancets 4 (four) times daily.  . Ascorbic Acid (VITAMIN C) 1000 MG tablet Take 1,000 mg by mouth daily.  Marland Kitchen aspirin EC 81 MG tablet Take 81 mg by mouth daily.  . Blood Glucose Monitoring Suppl (ACCU-CHEK AVIVA PLUS) w/Device KIT Test BG 4 x daily. e11.65  . [place]-Glucosamine-Vit D (OSTEO BI-FLEX ONE PER DAY PO) Take 2 tablets by mouth daily.  . Cholecalciferol (VITAMIN D) 125 MCG (5000 UT) CAPS Take 5,000 Units by mouth daily.  . cloNIDine (CATAPRES) 0.1 MG tablet TAKE ONE TABLET BY MOUTH 2 TIMES A DAY  . glucose blood (ACCU-CHEK GUIDE) test strip Use to monitor glucose twice daily, before breakfast and before bed.  . hydrochlorothiazide (HYDRODIURIL) 25 MG tablet TAKE 1 TABLET BY MOUTH ONCE DAILY.  . hydroquinone 4 % cream APPLY TO DARK SPOTS 2CTIMES A DAY AS NEEDED  . losartan (COZAAR) 100 MG tablet TAKE ONE TABLET BY MOUTH ONCE DAILY.  . metFORMIN (GLUCOPHAGE) 500 MG tablet TAKE (1) TABLET BY MOUTH TWICE DAILY WITH A MEAL.  . Multiple Vitamins-Minerals (CENTRUM SILVER ULTRA WOMENS) TABS Take 1 tablet by mouth every morning.  . simvastatin (ZOCOR) 20 MG tablet TAKE 1 TABLET BY MOUTH ONCE DAILY.   No facility-administered encounter medications on file as of 10/02/2020.   ALLERGIES: No Known Allergies VACCINATION STATUS:  There is no immunization history on file for this patient.  Diabetes She presents for her follow-up diabetic visit. She has  type 2 diabetes mellitus. Onset time: She was diagnosed at approximate age of 17 years. Her disease course has been stable. There are no hypoglycemic associated symptoms. Pertinent negatives for hypoglycemia include no confusion, headaches, pallor or seizures. There are no diabetic associated symptoms. Pertinent negatives for diabetes include no chest pain, no polydipsia, no polyphagia and no polyuria. There are no hypoglycemic complications. Symptoms are stable. There are no diabetic complications. Risk factors for coronary artery  disease include diabetes mellitus, dyslipidemia, hypertension, obesity, sedentary lifestyle and post-menopausal. Current diabetic treatment includes oral agent (monotherapy). She is compliant with treatment all of the time. Her weight is fluctuating minimally. She is following a diabetic diet. When asked about meal planning, she reported none. She has had a previous visit with a dietitian. She participates in exercise intermittently. (She presents today with no meter or logs to review.  She does not monitor glucose routinely due to monotherapy of Metformin only.  Her previsit A1c was 6.3%, essentially unchanged from previous visit of 6.4%.  She has plans to rejoin the [facility] now that Covid is on the decline so she can exercise more and lose some weight.  She denies any s/s of hypoglycemia.) An ACE inhibitor/angiotensin II receptor blocker is being taken. She does not see a podiatrist.Eye exam is current.  Hyperlipidemia This is a chronic problem. The current episode started more than 1 year ago. The problem is controlled. Recent lipid tests were reviewed and are variable. Exacerbating diseases include chronic renal disease and diabetes. There are no known factors aggravating her hyperlipidemia. Pertinent negatives include no chest pain, focal sensory loss, myalgias or shortness of breath. Current antihyperlipidemic treatment includes statins. The current treatment provides moderate improvement of lipids. Compliance problems include adherence to exercise.  Risk factors for coronary artery disease include diabetes mellitus, dyslipidemia, hypertension, obesity, post-menopausal and a sedentary lifestyle.  Hypertension This is a chronic problem. The current episode started more than 1 year ago. The problem has been waxing and waning since onset. The problem is uncontrolled. Pertinent negatives include no chest pain, headaches, palpitations or shortness of breath. There are no associated agents to hypertension. Risk  factors for coronary artery disease include diabetes mellitus, obesity, sedentary lifestyle, post-menopausal state and dyslipidemia. Past treatments include ACE inhibitors and diuretics. The current treatment provides mild improvement. Compliance problems include exercise.  Hypertensive end-organ damage includes kidney disease. Identifiable causes of hypertension include chronic renal disease.    Review of systems  Constitutional: + Minimally fluctuating body weight,  current Body mass index is 40.34 kg/m. , no fatigue, no subjective hyperthermia, no subjective hypothermia Eyes: no blurry vision, no xerophthalmia ENT: no sore throat, no nodules palpated in throat, no dysphagia/odynophagia, no hoarseness Cardiovascular: no chest pain, no shortness of breath, no palpitations, no leg swelling Respiratory: no cough, no shortness of breath Gastrointestinal: no nausea/vomiting/diarrhea Musculoskeletal: no muscle/joint aches, walks with walker due to disequilibrium and knee pain Skin: no rashes, no hyperemia Neurological: no tremors, no numbness, no tingling, no dizziness Psychiatric: no depression, no anxiety   Objective:    BP (!) 185/113 (BP Location: Right Arm, Patient Position: Sitting)   Pulse 97   Ht '5\' 9"'  (1.753 m)   Wt 273 lb 3.2 oz (123.9 kg)   BMI 40.34 kg/m   Wt Readings from Last 3 Encounters:  10/02/20 273 lb 3.2 oz (123.9 kg)  04/04/20 270 lb (122.5 kg)  04/04/20 270 lb (122.5 kg)     BP Readings from Last 3 Encounters:  10/02/20 Marland Kitchen)  185/113  04/04/20 123/81  09/30/19 (!) 160/98    Physical Exam- Limited  Constitutional:  Body mass index is 40.34 kg/m. , not in acute distress, mildly anxious state of mind Eyes:  EOMI, no exophthalmos Neck: Supple Cardiovascular: RRR, no murmers, rubs, or gallops, no edema Respiratory: Adequate breathing efforts, no crackles, rales, rhonchi, or wheezing Musculoskeletal: no gross deformities, strength intact in all four extremities,  no gross restriction of joint movements, walks with walker due to disequilibrium Skin:  no rashes, no hyperemia Neurological: no tremor with outstretched hands  CMP     Component Value Date/Time   NA 144 09/25/2020 0807   K 3.9 09/25/2020 0807   CL 103 09/25/2020 0807   CO2 25 09/25/2020 0807   GLUCOSE 119 (H) 09/25/2020 0807   GLUCOSE 112 (H) 03/27/2020 0801   BUN 13 09/25/2020 0807   CREATININE 0.84 09/25/2020 0807   CREATININE 0.76 03/27/2020 0801   CALCIUM 9.8 09/25/2020 0807   PROT 7.5 09/25/2020 0807   ALBUMIN 4.5 09/25/2020 0807   AST 15 09/25/2020 0807   ALT 14 09/25/2020 0807   ALKPHOS 95 09/25/2020 0807   BILITOT 0.5 09/25/2020 0807   GFRNONAA 82 03/27/2020 0801   GFRAA 95 03/27/2020 0801     Diabetic Labs (most recent): Lab Results  Component Value Date   HGBA1C 6.3 (H) 09/25/2020   HGBA1C 6.4 (A) 09/30/2019   HGBA1C 6.5 (H) 03/23/2019      Assessment & Plan:   1) Uncontrolled type 2 diabetes mellitus without complication, without long-term current use of insulin (HCC)  -She  remains at a high risk for more acute and chronic complications of diabetes which include CAD, CVA, CKD, retinopathy, and neuropathy. These are all discussed in detail with the patient.  She presents today with no meter or logs to review.  She does not monitor glucose routinely due to monotherapy of Metformin only.  Her previsit A1c was 6.3%, essentially unchanged from previous visit of 6.4%.  She has plans to rejoin the [facility] now that Covid is on the decline so she can exercise more and lose some weight.  She denies any s/s of hypoglycemia.  -  Recent labs reviewed.  - Nutritional counseling repeated at each appointment due to patients tendency to fall back in to old habits.  - The patient admits there is a room for improvement in their diet and drink choices. -  Suggestion is made for the patient to avoid simple carbohydrates from their diet including Cakes, Sweet Desserts /  Pastries, Ice Cream, Soda (diet and regular), Sweet Tea, Candies, Chips, Cookies, Sweet Pastries,  Store Bought Juices, Alcohol in Excess of  1-2 drinks a day, Artificial Sweeteners, Coffee Creamer, and "Sugar-free" Products. This will help patient to have stable blood glucose profile and potentially avoid unintended weight gain.   - I encouraged the patient to switch to  unprocessed or minimally processed complex starch and increased protein intake (animal or plant source), fruits, and vegetables.   - Patient is advised to stick to a routine mealtimes to eat 3 meals  a day and avoid unnecessary snacks ( to snack only to correct hypoglycemia).  - I have approached patient with the following individualized plan to manage diabetes and patient agrees.  -Given her controlled glycemic profile, she is advised to continue Metformin 500 mg po twice daily with meals.  No need for her to routinely monitor glucose for now.  - Patient will be considered for incretin therapy  If  she loses control .  - Patient specific target  for A1c; LDL, HDL, Triglycerides, and  Waist Circumference were discussed in detail.  2) BP/HTN:  Her blood pressure is not controlled to target.  She took her medications approximately 1 hour prior to her appointment and reports her BP is always elevated because she gets nervous.  She does monitor her BP at home at times and does not report high readings. She is advised to continue Clonidine 0.1 mg po twice daily, HCTZ 25 mg po daily, and Cozaar 100 mg po daily.   3) Lipids/HPL:  Her most recent lipid panel from 03/27/20 shows controlled LDL of 71 and elevated triglycerides of 194.  She is advised to continue Simvastatin 20 mg po daily at bedtime.  She is advised to avoid fried foods or foods prepared with butter.  4)  Weight/Diet:  Her Body mass index is 40.34 kg/m.-- clearly complicating her diabetes care.  She is a candidate for modest weight loss.  CDE consult in progress,  exercise, and carbohydrates information provided.  5) Chronic Care/Health Maintenance: -Patient is on ACEI/ARB and Statin medications and encouraged to continue to follow up with Ophthalmology, Podiatrist at least yearly or according to recommendations, and advised to  stay away from smoking. I have recommended yearly flu vaccine and pneumonia vaccination at least every 5 years; moderate intensity exercise for up to 150 minutes weekly; and  sleep for at least 7 hours a day.  - I advised patient to maintain close follow up with Leslie Andrea, MD for primary care needs.  - Time spent on this patient care encounter:  40 min, of which > 50% was spent in  counseling and the rest reviewing her blood glucose logs , discussing her hypoglycemia and hyperglycemia episodes, reviewing her current and  previous labs / studies  ( including abstraction from other facilities) and medications  doses and developing a  long term treatment plan and documenting her care.   Please refer to Patient Instructions for Blood Glucose Monitoring and Insulin/Medications Dosing Guide"  in media tab for additional information. Please  also refer to " Patient Self Inventory" in the Media  tab for reviewed elements of pertinent patient history.  Karen Rios participated in the discussions, expressed understanding, and voiced agreement with the above plans.  All questions were answered to her satisfaction. she is encouraged to contact clinic should she have any questions or concerns prior to her return visit.    Follow up plan: -Return in about 6 months (around 04/04/2021) for Diabetes follow up- A1c and urine micro in office, Previsit labs.  Rayetta Pigg, [facility] [address] [place], [place] [address] Phone: [phone] Fax: [phone]    10/02/2020, 10:37 AM

## 2020-10-02 NOTE — Progress Notes (Signed)
   Medical Nutrition Therapy:  Appt start time: 1045  end time: 1100 Assessment:  Assessment:  Primary concerns today: Diabetes Type 2. .   Saw Dr. Fransico Him, Endocrinology today. Metformin 500 mg BID. Going to start going back to the [facility]. A1C down to 6.3%   Eating meals on time. Avoiding snacks. Doing very well. Drinking water. Feels great. Wants to get back to [facility] for water aerobics. Wt stable but wants to lose more.  Lab Results  Component Value Date   HGBA1C 6.3 (H) 09/25/2020   CMP Latest Ref Rng & Units 09/25/2020 03/27/2020 03/23/2019  Glucose 65 - 99 mg/dL 841(L) 244(W) 102(V)  BUN 8 - 27 mg/dL 13 13 15   Creatinine 0.57 - 1.00 mg/dL 2.53 6.64  Sodium 134 - 144 mmol/L 144 142 142  Potassium 3.5 - 5.2 mmol/L 3.9 3.9 4.1  Chloride 96 - 106 mmol/L 103 107 105  CO2 20 - 29 mmol/L 25 25 25   Calcium 8.7 - 10.3 mg/dL 9.8 9.4 9.6  Total Protein 6.0 - 8.5 g/dL 7.5 7.2 7.1  Total Bilirubin 0.0 - 1.2 mg/dL 0.5 0.4 0.4  Alkaline Phos 44 - 121 IU/L 95 - -  AST 0 - 40 IU/L 15 15 15   ALT 0 - 32 IU/L 14 12 15     Wt Readings from Last 3 Encounters:  10/02/20 270 lb (122.5 kg)  10/02/20 273 lb 3.2 oz (123.9 kg)  04/04/20 270 lb (122.5 kg)   Ht Readings from Last 3 Encounters:  10/02/20 5\' 9"  (1.753 m)  10/02/20 5\' 9"  (1.753 m)  04/04/20 5\' 9"  (1.753 m)   Body mass index is 39.87 kg/m. @BMIFA @ Facility age limit for growth percentiles is 20 years. Facility age limit for growth percentiles is 20 years.  Preferred Learning Style:     No preference indicated   Learning Readiness:   Ready  Change in progress  MEDICATIONS: see list   DIETARY INTAKE:  24-hr recall:  B ( AM): Oatmeal boiled egg and fruit, water  Snk ( AM): none L ( PM):baked chicken, salad, water D ( PM):chicken, salad, mixed vegetables, water  Usual physical activity:  Goinng to [facility] for water aerobics 4 times per week and walking in the pool  Estimated energy needs: 1800 calories 200 g  carbohydrates 135 g protein 50 g fat  Progress Towards Goal(s):  In progress.   Nutritional Diagnosis:  NB-1.1 Food and nutrition-related knowledge deficit As related to Diabetes.  As evidenced by A1C that was 18% and now down to 5.5% .    Intervention:  Nutrition counseling and diabetes education on meal planning with My Plate, carb counting, portion sizes, weight loss tips, benefits of exercise and prevention of complications from DM.  Weight loss tips and increasing physical activity. Avoiding salty processed foods.  .Goals  Increase high fiber foods to help lower your triglycerides. Keep up the great job!!! Stay active with swimming at the [facility] 4 times per week. Lose 2-3 lbs per month.   Teaching Method Utilized:Visual Auditory Hands on  Handouts given during visit include: The Plate Method Meal Plan Card  Barriers to learning/adherence to lifestyle change: none  Demonstrated degree of understanding via:  Teach Back   Monitoring/Evaluation:  Dietary intake, exercise, meal planning, and body weight in 6 month(s) to continue to work on weight loss. 

## 2020-10-02 NOTE — Patient Instructions (Addendum)
.  Goals  Increase high fiber foods to help lower your triglycerides. Keep up the great job!!! Stay active with swimming at the [facility] 4 times per week. Lose 2-3 lbs per month.

## 2020-10-03 ENCOUNTER — Encounter: Payer: Self-pay | Admitting: Nutrition

## 2020-10-19 ENCOUNTER — Other Ambulatory Visit: Payer: Self-pay | Admitting: "Endocrinology

## 2020-11-18 ENCOUNTER — Other Ambulatory Visit: Payer: Self-pay | Admitting: "Endocrinology

## 2020-11-24 DIAGNOSIS — E559 Vitamin D deficiency, unspecified: Secondary | ICD-10-CM | POA: Diagnosis not present

## 2020-11-24 DIAGNOSIS — E119 Type 2 diabetes mellitus without complications: Secondary | ICD-10-CM | POA: Diagnosis not present

## 2020-11-24 DIAGNOSIS — I10 Essential (primary) hypertension: Secondary | ICD-10-CM | POA: Diagnosis not present

## 2020-11-28 DIAGNOSIS — E119 Type 2 diabetes mellitus without complications: Secondary | ICD-10-CM | POA: Diagnosis not present

## 2020-11-28 DIAGNOSIS — I10 Essential (primary) hypertension: Secondary | ICD-10-CM | POA: Diagnosis not present

## 2020-11-28 DIAGNOSIS — E559 Vitamin D deficiency, unspecified: Secondary | ICD-10-CM | POA: Diagnosis not present

## 2020-12-20 ENCOUNTER — Other Ambulatory Visit: Payer: Self-pay | Admitting: "Endocrinology

## 2021-01-18 ENCOUNTER — Other Ambulatory Visit: Payer: Self-pay | Admitting: "Endocrinology

## 2021-01-18 ENCOUNTER — Other Ambulatory Visit: Payer: Self-pay | Admitting: Nurse Practitioner

## 2021-02-19 ENCOUNTER — Other Ambulatory Visit: Payer: Self-pay | Admitting: "Endocrinology

## 2021-03-23 ENCOUNTER — Other Ambulatory Visit: Payer: Self-pay | Admitting: Nurse Practitioner

## 2021-03-23 NOTE — Telephone Encounter (Signed)
Do you want to continue to fill this medication? Or send to PCP?

## 2021-03-26 DIAGNOSIS — E559 Vitamin D deficiency, unspecified: Secondary | ICD-10-CM | POA: Diagnosis not present

## 2021-03-26 DIAGNOSIS — E119 Type 2 diabetes mellitus without complications: Secondary | ICD-10-CM | POA: Diagnosis not present

## 2021-03-27 DIAGNOSIS — I10 Essential (primary) hypertension: Secondary | ICD-10-CM | POA: Diagnosis not present

## 2021-03-27 DIAGNOSIS — E785 Hyperlipidemia, unspecified: Secondary | ICD-10-CM | POA: Diagnosis not present

## 2021-03-27 DIAGNOSIS — E559 Vitamin D deficiency, unspecified: Secondary | ICD-10-CM | POA: Diagnosis not present

## 2021-03-27 DIAGNOSIS — E119 Type 2 diabetes mellitus without complications: Secondary | ICD-10-CM | POA: Diagnosis not present

## 2021-03-27 LAB — COMPREHENSIVE METABOLIC PANEL
ALT: 12 IU/L (ref 0–32)
AST: 21 IU/L (ref 0–40)
Albumin/Globulin Ratio: 1.8 (ref 1.2–2.2)
Albumin: 4.8 g/dL (ref 3.8–4.8)
Alkaline Phosphatase: 88 IU/L (ref 44–121)
BUN/Creatinine Ratio: 13 (ref 12–28)
BUN: 10 mg/dL (ref 8–27)
Bilirubin Total: 0.4 mg/dL (ref 0.0–1.2)
CO2: 25 mmol/L (ref 20–29)
Calcium: 9.9 mg/dL (ref 8.7–10.3)
Chloride: 101 mmol/L (ref 96–106)
Creatinine, Ser: 0.79 mg/dL (ref 0.57–1.00)
Globulin, Total: 2.7 g/dL (ref 1.5–4.5)
Glucose: 110 mg/dL — ABNORMAL HIGH (ref 65–99)
Potassium: 4 mmol/L (ref 3.5–5.2)
Sodium: 140 mmol/L (ref 134–144)
Total Protein: 7.5 g/dL (ref 6.0–8.5)
eGFR: 82 mL/min/{1.73_m2} (ref >59)

## 2021-03-27 LAB — LIPID PANEL
Chol/HDL Ratio: 3.3 ratio (ref 0.0–4.4)
Cholesterol, Total: 161 mg/dL (ref 100–199)
HDL: 49 mg/dL (ref >39)
LDL Chol Calc (NIH): 83 mg/dL (ref 0–99)
Triglycerides: 173 mg/dL — ABNORMAL HIGH (ref 0–149)
VLDL Cholesterol Cal: 29 mg/dL (ref 5–40)

## 2021-03-27 LAB — T4, FREE: Free T4: 1.54 ng/dL (ref 0.82–1.77)

## 2021-03-27 LAB — VITAMIN D 25 HYDROXY (VIT D DEFICIENCY, FRACTURES): Vit D, 25-Hydroxy: 154 ng/mL — ABNORMAL HIGH (ref 30.0–100.0)

## 2021-03-27 LAB — TSH: TSH: 1.48 u[IU]/mL (ref 0.450–4.500)

## 2021-04-03 NOTE — Patient Instructions (Signed)

## 2021-04-04 ENCOUNTER — Encounter: Payer: Medicare Other | Attending: "Endocrinology | Admitting: Nutrition

## 2021-04-04 ENCOUNTER — Encounter: Payer: Self-pay | Admitting: Nutrition

## 2021-04-04 ENCOUNTER — Ambulatory Visit: Payer: Medicare Other | Admitting: Nurse Practitioner

## 2021-04-04 ENCOUNTER — Other Ambulatory Visit: Payer: Self-pay

## 2021-04-04 ENCOUNTER — Encounter: Payer: Self-pay | Admitting: Nurse Practitioner

## 2021-04-04 VITALS — BP 196/103 | HR 94 | Ht 69.0 in | Wt 265.8 lb

## 2021-04-04 VITALS — Ht 69.0 in | Wt 265.0 lb

## 2021-04-04 DIAGNOSIS — E673 Hypervitaminosis D: Secondary | ICD-10-CM | POA: Diagnosis not present

## 2021-04-04 DIAGNOSIS — E782 Mixed hyperlipidemia: Secondary | ICD-10-CM | POA: Diagnosis not present

## 2021-04-04 DIAGNOSIS — E119 Type 2 diabetes mellitus without complications: Secondary | ICD-10-CM | POA: Diagnosis not present

## 2021-04-04 DIAGNOSIS — E669 Obesity, unspecified: Secondary | ICD-10-CM | POA: Diagnosis present

## 2021-04-04 DIAGNOSIS — Z6839 Body mass index (BMI) 39.0-39.9, adult: Secondary | ICD-10-CM | POA: Insufficient documentation

## 2021-04-04 DIAGNOSIS — I10 Essential (primary) hypertension: Secondary | ICD-10-CM | POA: Insufficient documentation

## 2021-04-04 DIAGNOSIS — E559 Vitamin D deficiency, unspecified: Secondary | ICD-10-CM

## 2021-04-04 LAB — POCT GLYCOSYLATED HEMOGLOBIN (HGB A1C): Hemoglobin A1C: 5.8 % — AB (ref 4.0–5.6)

## 2021-04-04 NOTE — Progress Notes (Signed)
Medical Nutrition Therapy:  Appt start time: 1100  end time: 1115 Assessment:  Assessment:  Primary concerns today: Diabetes Type 2. .   Saw Dr. Fransico Him, Endocrinology today. Metformin 500 mg BID. Going to start going back to the [facility]. A1C down to 5.8%   Eating meals on time. Avoiding snacks. Doing very well. Drinking water. Feels great.  Lost 8 lbs since last visit.   Lab Results  Component Value Date   HGBA1C 5.8 (A) 04/04/2021   CMP Latest Ref Rng & Units 03/26/2021 09/25/2020 03/27/2020  Glucose 65 - 99 mg/dL 782(U) 235(T) 614(E)  BUN 8 - 27 mg/dL 10 13 13   Creatinine 0.57 - 1.00 mg/dL 3.15 4.00  Sodium 134 - 144 mmol/L 140 144 142  Potassium 3.5 - 5.2 mmol/L 4.0 3.9 3.9  Chloride 96 - 106 mmol/L 101 103 107  CO2 20 - 29 mmol/L 25 25 25   Calcium 8.7 - 10.3 mg/dL 9.9 9.8 9.4  Total Protein 6.0 - 8.5 g/dL 7.5 7.5 7.2  Total Bilirubin 0.0 - 1.2 mg/dL 0.4 0.5 0.4  Alkaline Phos 44 - 121 IU/L 88 95 -  AST 0 - 40 IU/L 21 15 15   ALT 0 - 32 IU/L 12 14 12     Wt Readings from Last 3 Encounters:  04/04/21 265 lb 12.8 oz (120.6 kg)  10/02/20 270 lb (122.5 kg)  10/02/20 273 lb 3.2 oz (123.9 kg)   Ht Readings from Last 3 Encounters:  04/04/21 5\' 9"  (1.753 m)  10/02/20 5\' 9"  (1.753 m)  10/02/20 5\' 9"  (1.753 m)   There is no height or weight on file to calculate BMI. @BMIFA @ Facility age limit for growth percentiles is 20 years. Facility age limit for growth percentiles is 20 years.  Preferred Learning Style:   No preference indicated   Learning Readiness:  Ready Change in progress  MEDICATIONS: see list   DIETARY INTAKE:  24-hr recall:  B ( AM): Oatmeal boiled egg and fruit, water  Snk ( AM): none L ( PM):baked chicken, string beans, fruit, water D ( PM):TUrkey meat, pinto beans, corn, water  Usual physical activity:  Goinng to [facility] for water aerobics 4 times per week and walking in the pool  Estimated energy needs: 1800 calories 200 g carbohydrates 135 g  protein 50 g fat  Progress Towards Goal(s):  In progress.   Nutritional Diagnosis:  NB-1.1 Food and nutrition-related knowledge deficit As related to Diabetes.  As evidenced by A1C that was 18% and now down to 5.5% .    Intervention:  Nutrition counseling and diabetes education on meal planning with My Plate, carb counting, portion sizes, weight loss tips, benefits of exercise and prevention of complications from DM.  Weight loss tips and increasing physical activity. Avoiding salty processed foods.  .Goals  Increase high fiber foods to help lower your triglycerides. Keep up the great job!!! Stay active with swimming at the [facility] 4 times per week. Lose 2-3 lbs per month.   Teaching Method Utilized:Visual Auditory Hands on  Handouts given during visit include: The Plate Method Meal Plan Card  Barriers to learning/adherence to lifestyle change: none  Demonstrated degree of understanding via:  Teach Back   Monitoring/Evaluation:  Dietary intake, exercise, meal planning, and body weight in 6 month(s) to continue to work on weight loss. 

## 2021-04-04 NOTE — Patient Instructions (Signed)
Goals  Increase high fiber foods to help lower your triglycerides. Keep up the great job!!! Stay active with swimming at the [facility] 4 times per week. Lose 2-3 lbs per month. Follow Plant based meals plan

## 2021-04-04 NOTE — Progress Notes (Signed)
04/04/2021  Endocrinology follow-up note   TELEHEALTH VISIT: The patient is being engaged in telehealth visit due to COVID-19.  This type of visit limits physical examination significantly, and thus is not preferable over face-to-face encounters.  I connected with  Karen Rios on 04/04/21 by a video enabled telemedicine application and verified that I am speaking with the correct person using two identifiers.   I discussed the limitations of evaluation and management by telemedicine. The patient expressed understanding and agreed to proceed.    The participants involved in this visit include: Brita Romp, NP located at [facility] and Karen Rios  located at their personal residence listed.   Subjective:    Patient ID: Karen Rios, female    DOB: [birth year], PCP Leslie Andrea, MD   Past Medical History:  Diagnosis Date   Arthritis    Diabetes mellitus without complication ([place])    Hypertension    Past Surgical History:  Procedure Laterality Date   TUBAL LIGATION     Social History   Socioeconomic History   Marital status: Widowed    Spouse name: Not on file   Number of children: Not on file   Years of education: Not on file   Highest education level: Not on file  Occupational History   Occupation: retired  Tobacco Use   Smoking status: Never   Smokeless tobacco: Never  Vaping Use   Vaping Use: Never used  Substance and Sexual Activity   Alcohol use: No   Drug use: No   Sexual activity: Not on file  Other Topics Concern   Not on file  Social History Narrative   Not on file   Social Determinants of Health   Financial Resource Strain: Not on file  Food Insecurity: Not on file  Transportation Needs: Not on file  Physical Activity: Not on file  Stress: Not on file  Social Connections: Not on file   Outpatient Encounter Medications as of 04/04/2021  Medication Sig   Accu-Chek Softclix Lancets lancets USE TO TEST  BLOOD GLUCOSE 4 TIMES A DAY   Ascorbic Acid (VITAMIN C) 1000 MG tablet Take 1,000 mg by mouth daily.   aspirin EC 81 MG tablet Take 81 mg by mouth daily.   Blood Glucose Monitoring Suppl (ACCU-CHEK AVIVA PLUS) w/Device KIT Test BG 4 x daily. e11.65   cloNIDine (CATAPRES) 0.1 MG tablet TAKE ONE TABLET BY MOUTH 2 TIMES A DAY   glucose blood (ACCU-CHEK GUIDE) test strip Use to monitor glucose twice daily, before breakfast and before bed.   hydrochlorothiazide (HYDRODIURIL) 25 MG tablet TAKE 1 TABLET BY MOUTH ONCE DAILY.   hydroquinone 4 % cream APPLY TO DARK SPOTS 2CTIMES A DAY AS NEEDED   losartan (COZAAR) 100 MG tablet TAKE ONE TABLET BY MOUTH ONCE DAILY.   metFORMIN (GLUCOPHAGE) 500 MG tablet TAKE (1) TABLET BY MOUTH TWICE DAILY WITH A MEAL.   Multiple Vitamins-Minerals (CENTRUM SILVER ULTRA WOMENS) TABS Take 1 tablet by mouth every morning.   simvastatin (ZOCOR) 20 MG tablet TAKE 1 TABLET BY MOUTH ONCE DAILY.   [DISCONTINUED] Boswellia-Glucosamine-Vit D (OSTEO BI-FLEX ONE PER DAY PO) Take 2 tablets by mouth daily.   [DISCONTINUED] Cholecalciferol (VITAMIN D) 125 MCG (5000 UT) CAPS Take 5,000 Units by mouth daily.   No facility-administered encounter medications on file as of 04/04/2021.   ALLERGIES: No Known Allergies VACCINATION STATUS:  There is no immunization history on file for this patient.  Diabetes She presents for her  follow-up diabetic visit. She has type 2 diabetes mellitus. Onset time: She was diagnosed at approximate age of 39 years. Her disease course has been improving. There are no hypoglycemic associated symptoms. Pertinent negatives for hypoglycemia include no confusion, headaches, pallor or seizures. There are no diabetic associated symptoms. Pertinent negatives for diabetes include no chest pain, no polydipsia, no polyphagia and no polyuria. There are no hypoglycemic complications. Symptoms are stable. There are no diabetic complications. Risk factors for coronary artery  disease include diabetes mellitus, dyslipidemia, hypertension, obesity, sedentary lifestyle and post-menopausal. Current diabetic treatment includes oral agent (monotherapy). She is compliant with treatment all of the time. Her weight is decreasing steadily. She is following a diabetic diet. When asked about meal planning, she reported none. She has had a previous visit with a dietitian. She participates in exercise intermittently. (She presents today with no meter or logs to review.  She does not monitor glucose routinely due to monotherapy of Metformin only.  Her POCT A1c today is 5.8% improving from last visit of 6.3%.  She has lost some weight since last visit, a good development for her.  She has worked hard on her diet as well.  She denies any s/s of hypoglycemia.) An ACE inhibitor/angiotensin II receptor blocker is being taken. She does not see a podiatrist.Eye exam is current.  Hyperlipidemia This is a chronic problem. The current episode started more than 1 year ago. The problem is controlled. Recent lipid tests were reviewed and are variable. Exacerbating diseases include chronic renal disease and diabetes. Factors aggravating her hyperlipidemia include fatty foods. Pertinent negatives include no chest pain, focal sensory loss, myalgias or shortness of breath. Current antihyperlipidemic treatment includes statins. The current treatment provides moderate improvement of lipids. Compliance problems include adherence to exercise.  Risk factors for coronary artery disease include diabetes mellitus, dyslipidemia, hypertension, obesity, post-menopausal and a sedentary lifestyle.  Hypertension This is a chronic problem. The current episode started more than 1 year ago. The problem has been waxing and waning since onset. The problem is uncontrolled. Pertinent negatives include no chest pain, headaches, palpitations or shortness of breath. There are no associated agents to hypertension. Risk factors for coronary  artery disease include diabetes mellitus, obesity, sedentary lifestyle, post-menopausal state and dyslipidemia. Past treatments include ACE inhibitors and diuretics. The current treatment provides mild improvement. Compliance problems include exercise.  Hypertensive end-organ damage includes kidney disease. Identifiable causes of hypertension include chronic renal disease.   Review of systems  Constitutional: +steadily decreasing body weight,  current Body mass index is 39.25 kg/m. , no fatigue, no subjective hyperthermia, no subjective hypothermia Eyes: no blurry vision, no xerophthalmia ENT: no sore throat, no nodules palpated in throat, no dysphagia/odynophagia, no hoarseness Cardiovascular: no chest pain, no shortness of breath, no palpitations, no leg swelling Respiratory: no cough, no shortness of breath Gastrointestinal: no nausea/vomiting/diarrhea Musculoskeletal: no muscle/joint aches, walks with walker due to disequilibrium and knee pain Skin: no rashes, no hyperemia Neurological: no tremors, no numbness, no tingling, no dizziness Psychiatric: no depression, no anxiety   Objective:    BP (!) 196/103   Pulse 94   Ht 5' 9" (1.753 m)   Wt 265 lb 12.8 oz (120.6 kg)   BMI 39.25 kg/m   Wt Readings from Last 3 Encounters:  04/04/21 265 lb (120.2 kg)  04/04/21 265 lb 12.8 oz (120.6 kg)  10/02/20 270 lb (122.5 kg)     BP Readings from Last 3 Encounters:  04/04/21 (!) 196/103  10/02/20 Marland Kitchen)  185/113  04/04/20 123/81    Physical Exam- Limited  Constitutional:  Body mass index is 39.25 kg/m. , not in acute distress, mildly anxious state of mind, suboptimal personal hygiene with foul odor Eyes:  EOMI, no exophthalmos Neck: Supple Cardiovascular: RRR, no murmurs, rubs, or gallops, no edema Respiratory: Adequate breathing efforts, no crackles, rales, rhonchi, or wheezing Musculoskeletal: no gross deformities, strength intact in all four extremities, no gross restriction of joint  movements, walks with walker due to disequilibrium Skin:  no rashes, no hyperemia Neurological: no tremor with outstretched hands   Could not perform ABI due to inability to get on exam table.  CMP     Component Value Date/Time   NA 140 03/26/2021 0809   K 4.0 03/26/2021 0809   CL 101 03/26/2021 0809   CO2 25 03/26/2021 0809   GLUCOSE 110 (H) 03/26/2021 0809   GLUCOSE 112 (H) 03/27/2020 0801   BUN 10 03/26/2021 0809   CREATININE 0.79 03/26/2021 0809   CREATININE 0.76 03/27/2020 0801   CALCIUM 9.9 03/26/2021 0809   PROT 7.5 03/26/2021 0809   ALBUMIN 4.8 03/26/2021 0809   AST 21 03/26/2021 0809   ALT 12 03/26/2021 0809   ALKPHOS 88 03/26/2021 0809   BILITOT 0.4 03/26/2021 0809   GFRNONAA 82 03/27/2020 0801   GFRAA 95 03/27/2020 0801     Diabetic Labs (most recent): Lab Results  Component Value Date   HGBA1C 5.8 (A) 04/04/2021   HGBA1C 6.3 (H) 09/25/2020   HGBA1C 6.4 (A) 09/30/2019      Assessment & Plan:   1) Uncontrolled type 2 diabetes mellitus without complication, without long-term current use of insulin ([place])  -She  remains at a high risk for more acute and chronic complications of diabetes which include CAD, CVA, CKD, retinopathy, and neuropathy. These are all discussed in detail with the patient.  She presents today with no meter or logs to review.  She does not monitor glucose routinely due to monotherapy of Metformin only.  Her POCT A1c today is 5.8% improving from last visit of 6.3%.  She has lost some weight since last visit, a good development for her.  She has worked hard on her diet as well.  She denies any s/s of hypoglycemia.  -  Recent labs reviewed.  - Nutritional counseling repeated at each appointment due to patients tendency to fall back in to old habits.  - The patient admits there is a room for improvement in their diet and drink choices. -  Suggestion is made for the patient to avoid simple carbohydrates from their diet including Cakes, Sweet  Desserts / Pastries, Ice Cream, Soda (diet and regular), Sweet Tea, Candies, Chips, Cookies, Sweet Pastries, Store Bought Juices, Alcohol in Excess of 1-2 drinks a day, Artificial Sweeteners, Coffee Creamer, and "Sugar-free" Products. This will help patient to have stable blood glucose profile and potentially avoid unintended weight gain.   - I encouraged the patient to switch to unprocessed or minimally processed complex starch and increased protein intake (animal or plant source), fruits, and vegetables.   - Patient is advised to stick to a routine mealtimes to eat 3 meals a day and avoid unnecessary snacks (to snack only to correct hypoglycemia).  - I have approached patient with the following individualized plan to manage diabetes and patient agrees.  -Given her controlled glycemic profile, she is advised to continue Metformin 500 mg po twice daily with meals.  No need for her to routinely monitor glucose for now.  -  Patient will be considered for incretin therapy  If she loses control.  - Patient specific target  for A1c; LDL, HDL, Triglycerides, and  Waist Circumference were discussed in detail.  2) BP/HTN:  Her blood pressure is not controlled to target.  She says she did take her medications this morning and reports her BP is always elevated because she gets nervous.  She does monitor her BP at home at times and does not report high readings. She is advised to continue Clonidine 0.1 mg po twice daily, HCTZ 25 mg po daily, and Cozaar 100 mg po daily.  She is encouraged to reach out to me or PCP if BP readings at home are above 140/90 consistently.   3) Lipids/HPL:  Her most recent lipid panel from 03/26/21 shows controlled LDL of 83 and elevated triglycerides of 173-improving.  She is advised to continue Simvastatin 20 mg po daily at bedtime.  She is advised to avoid fried foods or foods prepared with butter.  4)  Weight/Diet:  Her Body mass index is 39.25 kg/m.-- clearly complicating her  diabetes care.  She is a candidate for modest weight loss.  CDE consult in progress, exercise, and carbohydrates information provided.  5) Hypervitaminosis D: Her recent vitamin D level from 03/26/21 was high at 154.  She takes several supplements with vitamin D in them.  She is advised to stop her Vitamin D supplements for now.  6) Chronic Care/Health Maintenance: -Patient is on ACEI/ARB and Statin medications and encouraged to continue to follow up with Ophthalmology, Podiatrist at least yearly or according to recommendations, and advised to  stay away from smoking. I have recommended yearly flu vaccine and pneumonia vaccination at least every 5 years; moderate intensity exercise for up to 150 minutes weekly; and  sleep for at least 7 hours a day.  - I advised patient to maintain close follow up with Leslie Andrea, MD for primary care needs.    I spent 30 minutes in the care of the patient today including review of labs from [place], Lipids, Thyroid Function, Hematology (current and previous including abstractions from other facilities); face-to-face time discussing  her blood glucose readings/logs, discussing hypoglycemia and hyperglycemia episodes and symptoms, medications doses, her options of short and long term treatment based on the latest standards of care / guidelines;  discussion about incorporating lifestyle medicine;  and documenting the encounter.    Please refer to Patient Instructions for Blood Glucose Monitoring and Insulin/Medications Dosing Guide"  in media tab for additional information. Please  also refer to " Patient Self Inventory" in the Media  tab for reviewed elements of pertinent patient history.  Karen Rios participated in the discussions, expressed understanding, and voiced agreement with the above plans.  All questions were answered to her satisfaction. she is encouraged to contact clinic should she have any questions or concerns prior to her return  visit.    Follow up plan: -Return in about 6 months (around 10/02/2021) for Diabetes F/U- A1c and UM in office, No previsit labs, Bring meter and logs.   Rayetta Pigg, [facility] [address] [place], [place] [address] Phone: [phone] Fax: [phone]    04/04/2021, 11:01 AM

## 2021-04-19 ENCOUNTER — Other Ambulatory Visit: Payer: Self-pay | Admitting: Nurse Practitioner

## 2021-05-21 ENCOUNTER — Other Ambulatory Visit: Payer: Self-pay | Admitting: Nurse Practitioner

## 2021-05-21 ENCOUNTER — Other Ambulatory Visit: Payer: Self-pay | Admitting: "Endocrinology

## 2021-05-22 ENCOUNTER — Other Ambulatory Visit: Payer: Self-pay | Admitting: Nurse Practitioner

## 2021-07-20 ENCOUNTER — Other Ambulatory Visit: Payer: Self-pay | Admitting: Nurse Practitioner

## 2021-07-31 DIAGNOSIS — Z23 Encounter for immunization: Secondary | ICD-10-CM | POA: Diagnosis not present

## 2021-07-31 DIAGNOSIS — I10 Essential (primary) hypertension: Secondary | ICD-10-CM | POA: Diagnosis not present

## 2021-07-31 DIAGNOSIS — E559 Vitamin D deficiency, unspecified: Secondary | ICD-10-CM | POA: Diagnosis not present

## 2021-07-31 DIAGNOSIS — E119 Type 2 diabetes mellitus without complications: Secondary | ICD-10-CM | POA: Diagnosis not present

## 2021-08-01 ENCOUNTER — Other Ambulatory Visit (HOSPITAL_COMMUNITY): Payer: Self-pay | Admitting: Nurse Practitioner

## 2021-08-01 DIAGNOSIS — Z1231 Encounter for screening mammogram for malignant neoplasm of breast: Secondary | ICD-10-CM

## 2021-08-09 ENCOUNTER — Ambulatory Visit (HOSPITAL_COMMUNITY)
Admission: RE | Admit: 2021-08-09 | Discharge: 2021-08-09 | Disposition: A | Discharge status: Home / Self Care | Payer: Medicare Other | Source: Ambulatory Visit | Attending: Nurse Practitioner | Admitting: Nurse Practitioner

## 2021-08-09 ENCOUNTER — Other Ambulatory Visit: Payer: Self-pay

## 2021-08-09 DIAGNOSIS — Z1231 Encounter for screening mammogram for malignant neoplasm of breast: Secondary | ICD-10-CM | POA: Diagnosis not present

## 2021-08-20 ENCOUNTER — Other Ambulatory Visit: Payer: Self-pay | Admitting: "Endocrinology

## 2021-08-20 ENCOUNTER — Other Ambulatory Visit: Payer: Self-pay | Admitting: Nurse Practitioner

## 2021-09-26 ENCOUNTER — Other Ambulatory Visit: Payer: Self-pay

## 2021-09-26 DIAGNOSIS — E119 Type 2 diabetes mellitus without complications: Secondary | ICD-10-CM

## 2021-10-02 ENCOUNTER — Other Ambulatory Visit: Payer: Self-pay

## 2021-10-02 ENCOUNTER — Encounter: Payer: Medicare Other | Attending: Nurse Practitioner | Admitting: Nutrition

## 2021-10-02 ENCOUNTER — Encounter: Payer: Self-pay | Admitting: Nurse Practitioner

## 2021-10-02 ENCOUNTER — Ambulatory Visit: Payer: Medicare Other | Admitting: Nurse Practitioner

## 2021-10-02 VITALS — Ht 69.0 in | Wt 263.0 lb

## 2021-10-02 VITALS — BP 189/91 | HR 91 | Ht 69.0 in | Wt 263.2 lb

## 2021-10-02 DIAGNOSIS — E559 Vitamin D deficiency, unspecified: Secondary | ICD-10-CM

## 2021-10-02 DIAGNOSIS — E782 Mixed hyperlipidemia: Secondary | ICD-10-CM | POA: Insufficient documentation

## 2021-10-02 DIAGNOSIS — E119 Type 2 diabetes mellitus without complications: Secondary | ICD-10-CM | POA: Diagnosis not present

## 2021-10-02 DIAGNOSIS — E669 Obesity, unspecified: Secondary | ICD-10-CM | POA: Diagnosis present

## 2021-10-02 DIAGNOSIS — I10 Essential (primary) hypertension: Secondary | ICD-10-CM | POA: Diagnosis not present

## 2021-10-02 LAB — POCT GLYCOSYLATED HEMOGLOBIN (HGB A1C): HbA1c, POC (controlled diabetic range): 5.7 % (ref 0.0–7.0)

## 2021-10-02 MED ORDER — METFORMIN HCL 500 MG PO TABS: 500.0000 mg | ORAL_TABLET | Freq: Every day | ORAL | 3 refills | Status: DC

## 2021-10-02 NOTE — Progress Notes (Signed)
Medical Nutrition Therapy:  Appt start time: 1030  end time: 1045 ?Assessment:  Assessment:  Primary concerns today: Diabetes Type 2.  ? ?Saw Ronny Bacon today. Reduced Metformin 500 mg once a day now. ?Going to the [facility] three times per week for water aerobics. ?Feels better with doing the classes and her legs feel better. ?She notes her clothes fit better.  ? ?Watching portion sizes. Drinking water. ?Lost 2 lbs. ?  ?Lab Results  ?Component Value Date  ? HGBA1C 5.7 10/02/2021  ? ?CMP Latest Ref Rng & Units 03/26/2021 09/25/2020 03/27/2020  ?Glucose 65 - 99 mg/dL 017(C) 944(H) 675(F)  ?BUN 8 - 27 mg/dL 10 13 13   ?Creatinine 0.57 - 1.00 mg/dL 1.63 8.46  ?Sodium 134 - 144 mmol/L 140 144 142  ?Potassium 3.5 - 5.2 mmol/L 4.0 3.9 3.9  ?Chloride 96 - 106 mmol/L 101 103 107  ?CO2 20 - 29 mmol/L 25 25 25   ?Calcium 8.7 - 10.3 mg/dL 9.9 9.8 9.4  ?Total Protein 6.0 - 8.5 g/dL 7.5 7.5 7.2  ?Total Bilirubin 0.0 - 1.2 mg/dL 0.4 0.5 0.4  ?Alkaline Phos 44 - 121 IU/L 88 95 -  ?AST 0 - 40 IU/L 21 15 15   ?ALT 0 - 32 IU/L 12 14 12   ? ? ?Wt Readings from Last 3 Encounters:  ?10/02/21 263 lb 3.2 oz (119.4 kg)  ?04/04/21 265 lb (120.2 kg)  ?04/04/21 265 lb 12.8 oz (120.6 kg)  ? ?Ht Readings from Last 3 Encounters:  ?10/02/21 5\' 9"  (1.753 m)  ?04/04/21 5\' 9"  (1.753 m)  ?04/04/21 5\' 9"  (1.753 m)  ? ?There is no height or weight on file to calculate BMI. ?@BMIFA @ ?Facility age limit for growth percentiles is 20 years. ?Facility age limit for growth percentiles is 20 years. ? ?Preferred Learning Style:  ? ?No preference indicated  ? ?Learning Readiness:  ?Ready ?Change in progress ? ?MEDICATIONS: see list ?  ?DIETARY INTAKE: ? ?24-hr recall:  ?B ( AM): Oatmeal boiled egg and fruit, water  ?Snk ( AM): none ?L ( PM):baked chicken, string beans, fruit, water ?D ( PM):TUrkey meat, pinto beans, corn, water ? ?Usual physical activity:  Goinng to [facility] for water aerobics 4 times per week and walking in the pool ? ?Estimated energy needs: ?1800  calories ?200 g carbohydrates ?135 g protein ?50 g fat ? ?Progress Towards Goal(s):  In progress. ?  ?Nutritional Diagnosis:  ?NB-1.1 Food and nutrition-related knowledge deficit As related to Diabetes.  As evidenced by A1C that was 18% and now down to 5.5% . ?   ?Intervention:  Nutrition counseling and diabetes education on meal planning with My Plate, carb counting, portion sizes, weight loss tips, benefits of exercise and prevention of complications from DM.  Weight loss tips and increasing physical activity. Avoiding salty processed foods. ? ?.Goals ? ?Increase high fiber foods to help lower your triglycerides. ?Keep up the great job!!! ?Stay active with swimming at the [facility] 4 times per week. ?Lose 2-3 lbs per month. ? ? ?Teaching Method Utilized:Visual ?Auditory ?Hands on ? ?Handouts given during visit include: ?The Plate Method ?Meal Plan Card ? ?Barriers to learning/adherence to lifestyle change: none ? ?Demonstrated degree of understanding via:  Teach Back  ? ?Monitoring/Evaluation:  Dietary intake, exercise, meal planning, and body weight in 6 month(s) to continue to work on weight loss. ?. ?

## 2021-10-02 NOTE — Progress Notes (Signed)
10/02/2021 ? ?Endocrinology follow-up note ? ? ?Subjective:  ? ? Patient ID: Karen Rios, female    DOB: 1956-01-25, PCP Leslie Andrea, MD ? ? ?Past Medical History:  ?Diagnosis Date  ? Arthritis   ? Diabetes mellitus without complication ([place])   ? Hypertension   ? ?Past Surgical History:  ?Procedure Laterality Date  ? TUBAL LIGATION    ? ?Social History  ? ?Socioeconomic History  ? Marital status: Widowed  ?  Spouse name: Not on file  ? Number of children: Not on file  ? Years of education: Not on file  ? Highest education level: Not on file  ?Occupational History  ? Occupation: retired  ?Tobacco Use  ? Smoking status: Never  ? Smokeless tobacco: Never  ?Vaping Use  ? Vaping Use: Never used  ?Substance and Sexual Activity  ? Alcohol use: No  ? Drug use: No  ? Sexual activity: Not on file  ?Other Topics Concern  ? Not on file  ?Social History Narrative  ? Not on file  ? ?Social Determinants of Health  ? ?Financial Resource Strain: Not on file  ?Food Insecurity: Not on file  ?Transportation Needs: Not on file  ?Physical Activity: Not on file  ?Stress: Not on file  ?Social Connections: Not on file  ? ?Outpatient Encounter Medications as of 10/02/2021  ?Medication Sig  ? Accu-Chek Softclix Lancets lancets USE TO TEST BLOOD GLUCOSE 4 TIMES A DAY  ? Ascorbic Acid (VITAMIN C) 1000 MG tablet Take 1,000 mg by mouth daily.  ? aspirin EC 81 MG tablet Take 81 mg by mouth daily.  ? Blood Glucose Monitoring Suppl (ACCU-CHEK AVIVA PLUS) w/Device KIT Test BG 4 x daily. e11.65  ? cloNIDine (CATAPRES) 0.1 MG tablet TAKE ONE TABLET BY MOUTH 2 TIMES A DAY  ? glucose blood (ACCU-CHEK GUIDE) test strip Use to monitor glucose twice daily, before breakfast and before bed.  ? hydrochlorothiazide (HYDRODIURIL) 25 MG tablet TAKE 1 TABLET BY MOUTH ONCE DAILY.  ? hydroquinone 4 % cream APPLY TO DARK SPOTS 2CTIMES A DAY AS NEEDED  ? losartan (COZAAR) 100 MG tablet TAKE ONE TABLET BY MOUTH ONCE DAILY.  ? Multiple Vitamins-Minerals (CENTRUM  SILVER ULTRA WOMENS) TABS Take 1 tablet by mouth every morning.  ? simvastatin (ZOCOR) 20 MG tablet TAKE 1 TABLET BY MOUTH ONCE DAILY.  ? [DISCONTINUED] metFORMIN (GLUCOPHAGE) 500 MG tablet TAKE (1) TABLET BY MOUTH TWICE DAILY WITH A MEAL.  ? metFORMIN (GLUCOPHAGE) 500 MG tablet Take 1 tablet (500 mg total) by mouth daily with breakfast.  ? ?No facility-administered encounter medications on file as of 10/02/2021.  ? ?ALLERGIES: ?No Known Allergies ?VACCINATION STATUS: ? ?There is no immunization history on file for this patient. ? ?Diabetes ?She presents for her follow-up diabetic visit. She has type 2 diabetes mellitus. Onset time: She was diagnosed at approximate age of 58 years. Her disease course has been stable. There are no hypoglycemic associated symptoms. Pertinent negatives for hypoglycemia include no confusion, headaches, pallor or seizures. There are no diabetic associated symptoms. Pertinent negatives for diabetes include no chest pain, no polydipsia, no polyphagia and no polyuria. There are no hypoglycemic complications. Symptoms are stable. There are no diabetic complications. Risk factors for coronary artery disease include diabetes mellitus, dyslipidemia, hypertension, obesity, sedentary lifestyle and post-menopausal. Current diabetic treatment includes oral agent (monotherapy). She is compliant with treatment all of the time. Her weight is decreasing steadily. She is following a diabetic diet. When asked about meal planning, she  reported none. She has had a previous visit with a dietitian. She participates in exercise intermittently. (She presents today with no meter or logs to review, she was not asked to routinely monitor glucose as she is on monotherapy with Metformin only.  Her POCT A1c today is 5.7%, stable from last visit.  She says she is continuing to work out and eat right.) An ACE inhibitor/angiotensin II receptor blocker is being taken. She does not see a podiatrist.Eye exam is current.   ?Hyperlipidemia ?This is a chronic problem. The current episode started more than 1 year ago. The problem is controlled. Recent lipid tests were reviewed and are variable. Exacerbating diseases include chronic renal disease and diabetes. Factors aggravating her hyperlipidemia include fatty foods. Pertinent negatives include no chest pain, focal sensory loss, myalgias or shortness of breath. Current antihyperlipidemic treatment includes statins. The current treatment provides moderate improvement of lipids. Compliance problems include adherence to exercise.  Risk factors for coronary artery disease include diabetes mellitus, dyslipidemia, hypertension, obesity, post-menopausal and a sedentary lifestyle.  ?Hypertension ?This is a chronic problem. The current episode started more than 1 year ago. The problem has been waxing and waning since onset. The problem is uncontrolled. Pertinent negatives include no chest pain, headaches, palpitations or shortness of breath. There are no associated agents to hypertension. Risk factors for coronary artery disease include diabetes mellitus, obesity, sedentary lifestyle, post-menopausal state and dyslipidemia. Past treatments include ACE inhibitors and diuretics. The current treatment provides mild improvement. Compliance problems include exercise.  Hypertensive end-organ damage includes kidney disease. Identifiable causes of hypertension include chronic renal disease.  ? ?Review of systems ? ?Constitutional: +steadily decreasing body weight,  current Body mass index is 38.87 kg/m?. , no fatigue, no subjective hyperthermia, no subjective hypothermia ?Eyes: no blurry vision, no xerophthalmia ?ENT: no sore throat, no nodules palpated in throat, no dysphagia/odynophagia, no hoarseness ?Cardiovascular: no chest pain, no shortness of breath, no palpitations, no leg swelling ?Respiratory: no cough, no shortness of breath ?Gastrointestinal: no nausea/vomiting/diarrhea ?Musculoskeletal:  no muscle/joint aches, walks with walker due to disequilibrium and knee pain ?Skin: no rashes, no hyperemia ?Neurological: no tremors, no numbness, no tingling, no dizziness ?Psychiatric: no depression, no anxiety ? ? ?Objective:  ?  ?BP (!) 189/91   Pulse 91   Ht '5\' 9"'  (1.753 m)   Wt 263 lb 3.2 oz (119.4 kg)   SpO2 99%   BMI 38.87 kg/m?   ?Wt Readings from Last 3 Encounters:  ?10/02/21 263 lb (119.3 kg)  ?10/02/21 263 lb 3.2 oz (119.4 kg)  ?04/04/21 265 lb (120.2 kg)  ?   ?BP Readings from Last 3 Encounters:  ?10/02/21 (!) 189/91  ?04/04/21 (!) 196/103  ?10/02/20 (!) 185/113  ? ? ? ?Physical Exam- Limited ? ?Constitutional:  Body mass index is 38.87 kg/m?. , not in acute distress, normal state of mind ?Eyes:  EOMI, no exophthalmos ?Neck: Supple ?Cardiovascular: RRR, no murmurs, rubs, or gallops, no edema ?Respiratory: Adequate breathing efforts, no crackles, rales, rhonchi, or wheezing ?Musculoskeletal: walks with walker due to disequilibrium and knee pain ?Skin:  no rashes, no hyperemia ?Neurological: no tremor with outstretched hands ? ?CMP  ?   ?Component Value Date/Time  ? NA 140 03/26/2021 0809  ? K 4.0 03/26/2021 0809  ? CL 101 03/26/2021 0809  ? CO2 25 03/26/2021 0809  ? GLUCOSE 110 (H) 03/26/2021 0809  ? GLUCOSE 112 (H) 03/27/2020 0801  ? BUN 10 03/26/2021 0809  ? CREATININE 0.79 03/26/2021 0809  ? CREATININE  0.76 03/27/2020 0801  ? CALCIUM 9.9 03/26/2021 0809  ? PROT 7.5 03/26/2021 0809  ? ALBUMIN 4.8 03/26/2021 0809  ? AST 21 03/26/2021 0809  ? ALT 12 03/26/2021 0809  ? ALKPHOS 88 03/26/2021 0809  ? BILITOT 0.4 03/26/2021 0809  ? GFRNONAA 82 03/27/2020 0801  ? GFRAA 95 03/27/2020 0801  ? ? ? ?Diabetic Labs (most recent): ?Lab Results  ?Component Value Date  ? HGBA1C 5.7 10/02/2021  ? HGBA1C 5.8 (A) 04/04/2021  ? HGBA1C 6.3 (H) 09/25/2020  ? ?  ? ?Assessment & Plan:  ? ?1) Uncontrolled type 2 diabetes mellitus without complication, without long-term current use of insulin ([place]) ? ?-She  remains at a  high risk for more acute and chronic complications of diabetes which include CAD, CVA, CKD, retinopathy, and neuropathy. These are all discussed in detail with the patient. ? ?She presents today with no meter

## 2021-10-02 NOTE — Patient Instructions (Signed)
Keep up the great!! ? ?[facility] ? ?Lifestyle Medicine ?- Whole Food, Plant Predominant Nutrition is highly recommended: Eat Plenty of vegetables, Mushrooms, fruits, Legumes, Whole Grains, Nuts, seeds in lieu of processed meats, processed snacks/pastries red meat, poultry, eggs.  ?  ?-It is better to avoid simple carbohydrates including: Cakes, Sweet Desserts, Ice Cream, Soda (diet and regular), Sweet Tea, Candies, Chips, Cookies, Store Bought Juices, Alcohol in Excess of  1-2 drinks a day, Lemonade,  Artificial Sweeteners, Doughnuts, Coffee Creamers, "Sugar-free" Products, etc, etc.  This is not a complete list..... ? ?Exercise: If you are able: 30 -60 minutes a day ,4 days a week, or 150 minutes a week.  The longer the better.  Combine stretch, strength, and aerobic activities.  If you were told in the past that you have high risk for cardiovascular diseases, you may seek evaluation by your heart doctor prior to initiating moderate to intense exercise programs. ?

## 2021-10-02 NOTE — Patient Instructions (Signed)
Advice for Weight Management  -For most of us the best way to lose weight is by diet management. Generally speaking, diet management means consuming less calories intentionally which over time brings about progressive weight loss.  This can be achieved more effectively by restricting carbohydrate consumption to the minimum possible.  So, it is critically important to know your numbers: how much calorie you are consuming and how much calorie you need. More importantly, our carbohydrates sources should be unprocessed or minimally processed complex starch food items.   Sometimes, it is important to balance nutrition by increasing protein intake (animal or plant source), fruits, and vegetables.  -Sticking to a routine mealtime to eat 3 meals a day and avoiding unnecessary snacks is shown to have a big role in weight control. Under normal circumstances, the only time we lose real weight is when we are hungry, so allow hunger to take place- hunger means no food between meal times, only water.  It is not advisable to starve.   -It is better to avoid simple carbohydrates including: Cakes, Sweet Desserts, Ice Cream, Soda (diet and regular), Sweet Tea, Candies, Chips, Cookies, Store Bought Juices, Alcohol in Excess of  1-2 drinks a day, Artificial Sweeteners, Doughnuts, Coffee Creamers, "Sugar-free" Products, etc, etc.  This is not a complete list.....    -Consulting with certified diabetes educators is proven to provide you with the most accurate and current information on diet.  Also, you may be  interested in discussing diet options/exchanges , we can schedule a visit with Karen Rios, RDN, CDE for individualized nutrition education.  -Exercise: If you are able: 30 -60 minutes a day ,4 days a week, or 150 minutes a week.  The longer the better.  Combine stretch, strength, and aerobic activities.  If you were told in the past that you have high risk for cardiovascular diseases, you may seek evaluation by  your heart doctor prior to initiating moderate to intense exercise programs.    

## 2021-10-04 ENCOUNTER — Encounter: Payer: Self-pay | Admitting: Nutrition

## 2021-10-18 ENCOUNTER — Other Ambulatory Visit: Payer: Self-pay | Admitting: Nurse Practitioner

## 2021-10-18 ENCOUNTER — Other Ambulatory Visit: Payer: Self-pay | Admitting: "Endocrinology

## 2021-10-26 ENCOUNTER — Other Ambulatory Visit: Payer: Self-pay | Admitting: "Endocrinology

## 2021-10-26 DIAGNOSIS — E119 Type 2 diabetes mellitus without complications: Secondary | ICD-10-CM

## 2021-11-20 ENCOUNTER — Other Ambulatory Visit: Payer: Self-pay | Admitting: Nurse Practitioner

## 2021-11-27 DIAGNOSIS — E119 Type 2 diabetes mellitus without complications: Secondary | ICD-10-CM | POA: Diagnosis not present

## 2021-11-27 DIAGNOSIS — I10 Essential (primary) hypertension: Secondary | ICD-10-CM | POA: Diagnosis not present

## 2021-11-27 DIAGNOSIS — Z634 Disappearance and death of family member: Secondary | ICD-10-CM | POA: Diagnosis not present

## 2021-12-31 DIAGNOSIS — Z789 Other specified health status: Secondary | ICD-10-CM | POA: Diagnosis not present

## 2022-01-31 ENCOUNTER — Other Ambulatory Visit: Payer: Self-pay | Admitting: "Endocrinology

## 2022-01-31 DIAGNOSIS — E119 Type 2 diabetes mellitus without complications: Secondary | ICD-10-CM

## 2022-02-19 ENCOUNTER — Other Ambulatory Visit: Payer: Self-pay | Admitting: "Endocrinology

## 2022-02-19 ENCOUNTER — Other Ambulatory Visit: Payer: Self-pay | Admitting: Nurse Practitioner

## 2022-02-19 DIAGNOSIS — E119 Type 2 diabetes mellitus without complications: Secondary | ICD-10-CM

## 2022-03-25 DIAGNOSIS — E119 Type 2 diabetes mellitus without complications: Secondary | ICD-10-CM | POA: Diagnosis not present

## 2022-03-25 DIAGNOSIS — E559 Vitamin D deficiency, unspecified: Secondary | ICD-10-CM | POA: Diagnosis not present

## 2022-03-26 LAB — VITAMIN D 25 HYDROXY (VIT D DEFICIENCY, FRACTURES): Vit D, 25-Hydroxy: 67.4 ng/mL (ref 30.0–100.0)

## 2022-03-26 LAB — COMPREHENSIVE METABOLIC PANEL
ALT: 14 IU/L (ref 0–32)
AST: 16 IU/L (ref 0–40)
Albumin/Globulin Ratio: 1.6 (ref 1.2–2.2)
Albumin: 4.4 g/dL (ref 3.9–4.9)
Alkaline Phosphatase: 86 IU/L (ref 44–121)
BUN/Creatinine Ratio: 16 (ref 12–28)
BUN: 12 mg/dL (ref 8–27)
Bilirubin Total: 0.5 mg/dL (ref 0.0–1.2)
CO2: 25 mmol/L (ref 20–29)
Calcium: 9.8 mg/dL (ref 8.7–10.3)
Chloride: 103 mmol/L (ref 96–106)
Creatinine, Ser: 0.73 mg/dL (ref 0.57–1.00)
Globulin, Total: 2.7 g/dL (ref 1.5–4.5)
Glucose: 110 mg/dL — ABNORMAL HIGH (ref 70–99)
Potassium: 4.2 mmol/L (ref 3.5–5.2)
Sodium: 143 mmol/L (ref 134–144)
Total Protein: 7.1 g/dL (ref 6.0–8.5)
eGFR: 90 mL/min/{1.73_m2} (ref >59)

## 2022-04-02 ENCOUNTER — Other Ambulatory Visit: Payer: Self-pay | Admitting: Nurse Practitioner

## 2022-04-02 DIAGNOSIS — E559 Vitamin D deficiency, unspecified: Secondary | ICD-10-CM | POA: Diagnosis not present

## 2022-04-02 DIAGNOSIS — E119 Type 2 diabetes mellitus without complications: Secondary | ICD-10-CM | POA: Diagnosis not present

## 2022-04-02 DIAGNOSIS — I10 Essential (primary) hypertension: Secondary | ICD-10-CM | POA: Diagnosis not present

## 2022-04-04 ENCOUNTER — Encounter: Payer: Medicare Other | Attending: Nurse Practitioner | Admitting: Nutrition

## 2022-04-04 ENCOUNTER — Encounter: Payer: Self-pay | Admitting: Nutrition

## 2022-04-04 ENCOUNTER — Encounter: Payer: Self-pay | Admitting: Nurse Practitioner

## 2022-04-04 ENCOUNTER — Ambulatory Visit: Payer: Medicare Other | Admitting: Nurse Practitioner

## 2022-04-04 VITALS — Ht 69.0 in | Wt 258.0 lb

## 2022-04-04 VITALS — BP 171/110 | HR 83 | Ht 69.0 in | Wt 258.4 lb

## 2022-04-04 DIAGNOSIS — Z6838 Body mass index (BMI) 38.0-38.9, adult: Secondary | ICD-10-CM | POA: Insufficient documentation

## 2022-04-04 DIAGNOSIS — E119 Type 2 diabetes mellitus without complications: Secondary | ICD-10-CM | POA: Insufficient documentation

## 2022-04-04 DIAGNOSIS — E782 Mixed hyperlipidemia: Secondary | ICD-10-CM

## 2022-04-04 DIAGNOSIS — E559 Vitamin D deficiency, unspecified: Secondary | ICD-10-CM

## 2022-04-04 DIAGNOSIS — I10 Essential (primary) hypertension: Secondary | ICD-10-CM | POA: Diagnosis not present

## 2022-04-04 DIAGNOSIS — E669 Obesity, unspecified: Secondary | ICD-10-CM | POA: Insufficient documentation

## 2022-04-04 LAB — POCT GLYCOSYLATED HEMOGLOBIN (HGB A1C): Hemoglobin A1C: 5.3 % (ref 4.0–5.6)

## 2022-04-04 MED ORDER — ACCU-CHEK GUIDE VI STRP: ORAL_STRIP | 0 refills | Status: DC

## 2022-04-04 NOTE — Progress Notes (Signed)
Medical Nutrition Therapy:  Appt start time: 1030  end time: 1045 Assessment:  Assessment:  Primary concerns today: Diabetes Type  Lost 5 lbs. A1C 5.3% Making great progress. Saw Rayetta Pigg today. Stopped Metformin Going to the [employer] three times per week for water aerobics.   Lab Results  Component Value Date   HGBA1C 5.3 04/04/2022      Latest Ref Rng & Units 03/25/2022    8:17 AM 03/26/2021    8:09 AM 09/25/2020    8:07 AM  CMP  Glucose 70 - 99 mg/dL 110  110  119   BUN 8 - 27 mg/dL 12  10  13    Creatinine 0.57 - 1.00 mg/dL 0.73  0.79  0.84   Sodium 134 - 144 mmol/L 143  140  144   Potassium 3.5 - 5.2 mmol/L 4.2  4.0  3.9   Chloride 96 - 106 mmol/L 103  101  103   CO2 20 - 29 mmol/L 25  25  25    Calcium 8.7 - 10.3 mg/dL 9.8  9.9  9.8   Total Protein 6.0 - 8.5 g/dL 7.1  7.5  7.5   Total Bilirubin 0.0 - 1.2 mg/dL 0.5  0.4  0.5   Alkaline Phos 44 - 121 IU/L 86  88  95   AST 0 - 40 IU/L 16  21  15    ALT 0 - 32 IU/L 14  12  14      Wt Readings from Last 3 Encounters:  04/04/22 258 lb 6.4 oz (117.2 kg)  10/02/21 263 lb (119.3 kg)  10/02/21 263 lb 3.2 oz (119.4 kg)   Ht Readings from Last 3 Encounters:  04/04/22 5\' 9"  (1.753 m)  10/02/21 5\' 9"  (1.753 m)  10/02/21 5\' 9"  (1.753 m)   There is no height or weight on file to calculate BMI. @BMIFA @ Facility age limit for growth %iles is 20 years. Facility age limit for growth %iles is 20 years.  Preferred Learning Style:   No preference indicated   Learning Readiness:  Ready Change in progress  MEDICATIONS: see list   DIETARY INTAKE:  24-hr recall:  B ( AM): Oatmeal boiled egg and fruit, water  Snk ( AM): none L ( PM):Chicken and salad, water or fish,water D ( PM):Salad, veggies, pintos beans, water  Usual physical activity:  Goinng to [facility] for water aerobics 4 times per week and walking in the pool  Estimated energy needs: 1800 calories 200 g carbohydrates 135 g protein 50 g fat  Progress Towards  Goal(s):  In progress.   Nutritional Diagnosis:  NB-1.1 Food and nutrition-related knowledge deficit As related to Diabetes.  As evidenced by A1C that was 18% and now down to 5.5% .    Intervention:  Nutrition counseling and diabetes education on meal planning with My Plate, carb counting, portion sizes, weight loss tips, benefits of exercise and prevention of complications from DM.  Weight loss tips and increasing physical activity. Avoiding salty processed foods.  Goals Keep up the great job!! Lose 10 lbs by next visit  Teaching Method Utilized:Visual Auditory Hands on  Handouts given during visit include: The Plate Method Meal Plan Card  Barriers to learning/adherence to lifestyle change: none  Demonstrated degree of understanding via:  Teach Back   Monitoring/Evaluation:  Dietary intake, exercise, meal planning, and body weight in 6 month(s) to continue to work on weight loss. Marland Kitchen

## 2022-04-04 NOTE — Progress Notes (Signed)
04/04/2022  Endocrinology follow-up note   Subjective:    Patient ID: Karen Rios, female    DOB: 08-27-1955, PCP Leslie Andrea, MD   Past Medical History:  Diagnosis Date   Arthritis    Diabetes mellitus without complication ([place])    Hypertension    Past Surgical History:  Procedure Laterality Date   TUBAL LIGATION     Social History   Socioeconomic History   Marital status: Widowed    Spouse name: Not on file   Number of children: Not on file   Years of education: Not on file   Highest education level: Not on file  Occupational History   Occupation: retired  Tobacco Use   Smoking status: Never   Smokeless tobacco: Never  Vaping Use   Vaping Use: Never used  Substance and Sexual Activity   Alcohol use: No   Drug use: No   Sexual activity: Not on file  Other Topics Concern   Not on file  Social History Narrative   Not on file   Social Determinants of Health   Financial Resource Strain: Not on file  Food Insecurity: Not on file  Transportation Needs: Not on file  Physical Activity: Not on file  Stress: Not on file  Social Connections: Not on file   Outpatient Encounter Medications as of 04/04/2022  Medication Sig   Accu-Chek Softclix Lancets lancets USE TO CHECK BLOOD GLUCOSE DAILY   Ascorbic Acid (VITAMIN C) 1000 MG tablet Take 1,000 mg by mouth daily.   aspirin EC 81 MG tablet Take 81 mg by mouth daily.   Blood Glucose Monitoring Suppl (ACCU-CHEK GUIDE) w/Device KIT USE TO TEST BLOOD GLUCOSE 4 TIMES DAILY E11.65   cloNIDine (CATAPRES) 0.1 MG tablet TAKE ONE TABLET BY MOUTH 2 TIMES A DAY   hydrochlorothiazide (HYDRODIURIL) 25 MG tablet TAKE 1 TABLET BY MOUTH ONCE DAILY.   hydroquinone 4 % cream APPLY TO DARK SPOTS 2CTIMES A DAY AS NEEDED   losartan (COZAAR) 100 MG tablet TAKE ONE TABLET BY MOUTH ONCE DAILY.   metFORMIN (GLUCOPHAGE) 500 MG tablet Take 1 tablet (500 mg total) by mouth daily with breakfast.   Multiple Vitamins-Minerals (CENTRUM  SILVER ULTRA WOMENS) TABS Take 1 tablet by mouth every morning.   simvastatin (ZOCOR) 20 MG tablet TAKE 1 TABLET BY MOUTH ONCE DAILY.   [DISCONTINUED] glucose blood (ACCU-CHEK GUIDE) test strip USE TO CHECK BLOOD GLUCOSE FOUR TIMES DAILY.   glucose blood (ACCU-CHEK GUIDE) test strip USE TO CHECK BLOOD GLUCOSE daily.   No facility-administered encounter medications on file as of 04/04/2022.   ALLERGIES: No Known Allergies VACCINATION STATUS:  There is no immunization history on file for this patient.  Diabetes She presents for her follow-up diabetic visit. She has type 2 diabetes mellitus. Onset time: She was diagnosed at approximate age of 21 years. Her disease course has been improving. There are no hypoglycemic associated symptoms. Pertinent negatives for hypoglycemia include no confusion, headaches, pallor or seizures. Associated symptoms include weight loss. Pertinent negatives for diabetes include no chest pain, no polydipsia, no polyphagia and no polyuria. There are no hypoglycemic complications. Symptoms are stable. There are no diabetic complications. Risk factors for coronary artery disease include diabetes mellitus, dyslipidemia, hypertension, obesity, sedentary lifestyle and post-menopausal. Current diabetic treatment includes oral agent (monotherapy). She is compliant with treatment all of the time. Her weight is decreasing steadily. She is following a diabetic diet. When asked about meal planning, she reported none. She has had a previous visit  with a dietitian. She participates in exercise intermittently. Her home blood glucose trend is decreasing steadily. Her overall blood glucose range is 110-130 mg/dl. (She presents today with her meter, no logs, showing stable glycemic profile overall.  Her POCT A1c today is 5.3%, improving from last visit of 5.7%.  She continues to work out routinely and eat healthy.  She has lost more weight- a personal goal for her.  Analysis of her meter shows  7-day average of 139, 14-day average of 132, 30-day average of 130, 90-day average of 132.) An ACE inhibitor/angiotensin II receptor blocker is being taken. She does not see a podiatrist.Eye exam is current.  Hyperlipidemia This is a chronic problem. The current episode started more than 1 year ago. The problem is controlled. Recent lipid tests were reviewed and are variable. Exacerbating diseases include chronic renal disease and diabetes. Factors aggravating her hyperlipidemia include fatty foods. Pertinent negatives include no chest pain, focal sensory loss, myalgias or shortness of breath. Current antihyperlipidemic treatment includes statins. The current treatment provides moderate improvement of lipids. Compliance problems include adherence to exercise.  Risk factors for coronary artery disease include diabetes mellitus, dyslipidemia, hypertension, obesity, post-menopausal and a sedentary lifestyle.  Hypertension This is a chronic problem. The current episode started more than 1 year ago. The problem has been waxing and waning since onset. The problem is uncontrolled. Pertinent negatives include no chest pain, headaches, palpitations or shortness of breath. There are no associated agents to hypertension. Risk factors for coronary artery disease include diabetes mellitus, obesity, sedentary lifestyle, post-menopausal state and dyslipidemia. Past treatments include ACE inhibitors and diuretics. The current treatment provides mild improvement. Compliance problems include exercise.  Hypertensive end-organ damage includes kidney disease. Identifiable causes of hypertension include chronic renal disease.    Review of systems  Constitutional: +steadily decreasing body weight,  current Body mass index is 38.16 kg/m. , no fatigue, no subjective hyperthermia, no subjective hypothermia Eyes: no blurry vision, no xerophthalmia ENT: no sore throat, no nodules palpated in throat, no dysphagia/odynophagia, no  hoarseness Cardiovascular: no chest pain, no shortness of breath, no palpitations, no leg swelling Respiratory: no cough, no shortness of breath Gastrointestinal: no nausea/vomiting/diarrhea Musculoskeletal: no muscle/joint aches, walks with walker due to disequilibrium and knee pain Skin: no rashes, no hyperemia Neurological: no tremors, no numbness, no tingling, no dizziness Psychiatric: no depression, no anxiety   Objective:    BP (!) 171/110 (BP Location: Right Arm, Patient Position: Sitting, Cuff Size: Large)   Pulse 83   Ht '5\' 9"'  (1.753 m)   Wt 258 lb 6.4 oz (117.2 kg)   BMI 38.16 kg/m   Wt Readings from Last 3 Encounters:  04/04/22 258 lb 6.4 oz (117.2 kg)  10/02/21 263 lb (119.3 kg)  10/02/21 263 lb 3.2 oz (119.4 kg)     BP Readings from Last 3 Encounters:  04/04/22 (!) 171/110  10/02/21 (!) 189/91  04/04/21 (!) 196/103     Physical Exam- Limited  Constitutional:  Body mass index is 38.16 kg/m. , not in acute distress, normal state of mind Eyes:  EOMI, no exophthalmos Neck: Supple Cardiovascular: RRR, no murmurs, rubs, or gallops, no edema Respiratory: Adequate breathing efforts, no crackles, rales, rhonchi, or wheezing Musculoskeletal: walks with walker due to disequilibrium and knee pain Skin:  no rashes, no hyperemia Neurological: no tremor with outstretched hands   Diabetic Foot Exam - Simple   No data filed     CMP     Component Value Date/Time  NA 143 03/25/2022 0817   K 4.2 03/25/2022 0817   CL 103 03/25/2022 0817   CO2 25 03/25/2022 0817   GLUCOSE 110 (H) 03/25/2022 0817   GLUCOSE 112 (H) 03/27/2020 0801   BUN 12 03/25/2022 0817   CREATININE 0.73 03/25/2022 0817   CREATININE 0.76 03/27/2020 0801   CALCIUM 9.8 03/25/2022 0817   PROT 7.1 03/25/2022 0817   ALBUMIN 4.4 03/25/2022 0817   AST 16 03/25/2022 0817   ALT 14 03/25/2022 0817   ALKPHOS 86 03/25/2022 0817   BILITOT 0.5 03/25/2022 0817   GFRNONAA 82 03/27/2020 0801   [place] 95  03/27/2020 0801     Diabetic Labs (most recent): Lab Results  Component Value Date   HGBA1C 5.3 04/04/2022   HGBA1C 5.7 10/02/2021   HGBA1C 5.8 (A) 04/04/2021   MICROALBUR 58.5 03/27/2020   MICROALBUR 8.9 11/14/2016      Assessment & Plan:   1) Uncontrolled type 2 diabetes mellitus without complication, without long-term current use of insulin ([place])  -She  remains at a high risk for more acute and chronic complications of diabetes which include CAD, CVA, CKD, retinopathy, and neuropathy. These are all discussed in detail with the patient.  She presents today with her meter, no logs, showing stable glycemic profile overall.  Her POCT A1c today is 5.3%, improving from last visit of 5.7%.  She continues to work out routinely and eat healthy.  She has lost more weight- a personal goal for her.  Analysis of her meter shows 7-day average of 139, 14-day average of 132, 30-day average of 130, 90-day average of 132.  -  Recent labs reviewed.  - Nutritional counseling repeated at each appointment due to patients tendency to fall back in to old habits.  - The patient admits there is a room for improvement in their diet and drink choices. -  Suggestion is made for the patient to avoid simple carbohydrates from their diet including Cakes, Sweet Desserts / Pastries, Ice Cream, Soda (diet and regular), Sweet Tea, Candies, Chips, Cookies, Sweet Pastries, Store Bought Juices, Alcohol in Excess of 1-2 drinks a day, Artificial Sweeteners, Coffee Creamer, and "Sugar-free" Products. This will help patient to have stable blood glucose profile and potentially avoid unintended weight gain.   - I encouraged the patient to switch to unprocessed or minimally processed complex starch and increased protein intake (animal or plant source), fruits, and vegetables.   - Patient is advised to stick to a routine mealtimes to eat 3 meals a day and avoid unnecessary snacks (to snack only to correct hypoglycemia).  - I  have approached patient with the following individualized plan to manage diabetes and patient agrees.  -Due to her improving glycemic profile, will stop her Metformin for now.    She does not need to routinely monitor glucose at this time but she prefers to monitor at least once daily.  I did send in rx for strips.  - Patient will be considered for incretin therapy  If she loses control.  - Patient specific target  for A1c; LDL, HDL, Triglycerides, and  Waist Circumference were discussed in detail.  2) BP/HTN:  Her blood pressure is not controlled to target.  She says she did take her medications this morning and reports her BP is always elevated because she gets nervous.  She is advised to continue Clonidine 0.1 mg po twice daily, HCTZ 25 mg po daily, and Cozaar 100 mg po daily.    3) Lipids/HPL:  Her  most recent lipid panel from 03/26/21 shows controlled LDL of 83 and elevated triglycerides of 173-improving.  She is advised to continue Simvastatin 20 mg po daily at bedtime.  She is advised to avoid fried foods or foods prepared with butter.  4)  Weight/Diet:  Her Body mass index is 38.16 kg/m.-- clearly complicating her diabetes care.  She is a candidate for modest weight loss.  CDE consult in progress, exercise, and carbohydrates information provided.  5) Hypervitaminosis D: Her recent vitamin D level from 03/26/21 was high at 154.  She takes several supplements with vitamin D in them.  She is advised to stop her Vitamin D supplements for now.  6) Chronic Care/Health Maintenance: -Patient is on ACEI/ARB and Statin medications and encouraged to continue to follow up with Ophthalmology, Podiatrist at least yearly or according to recommendations, and advised to  stay away from smoking. I have recommended yearly flu vaccine and pneumonia vaccination at least every 5 years; moderate intensity exercise for up to 150 minutes weekly; and  sleep for at least 7 hours a day.  - I advised patient to  maintain close follow up with Leslie Andrea, MD for primary care needs.     I spent 41 minutes in the care of the patient today including review of labs from [place], Lipids, Thyroid Function, Hematology (current and previous including abstractions from other facilities); face-to-face time discussing  her blood glucose readings/logs, discussing hypoglycemia and hyperglycemia episodes and symptoms, medications doses, her options of short and long term treatment based on the latest standards of care / guidelines;  discussion about incorporating lifestyle medicine;  and documenting the encounter. Risk reduction counseling performed per USPSTF guidelines to reduce obesity and cardiovascular risk factors.     Please refer to Patient Instructions for Blood Glucose Monitoring and Insulin/Medications Dosing Guide"  in media tab for additional information. Please  also refer to " Patient Self Inventory" in the Media  tab for reviewed elements of pertinent patient history.  Karen Rios participated in the discussions, expressed understanding, and voiced agreement with the above plans.  All questions were answered to her satisfaction. she is encouraged to contact clinic should she have any questions or concerns prior to her return visit.    Follow up plan: -Return in about 6 months (around 10/03/2022) for Diabetes F/U with A1c in office, No previsit labs.   Rayetta Pigg, [facility] [address] [place], [place] [address] Phone: [phone] Fax: [phone]    04/04/2022, 10:37 AM

## 2022-04-04 NOTE — Patient Instructions (Signed)
Goals  Keep up the great job!! Lose 10 lbs by next visit

## 2022-05-14 ENCOUNTER — Other Ambulatory Visit: Payer: Self-pay | Admitting: "Endocrinology

## 2022-06-25 NOTE — Progress Notes (Signed)
[facility] Quality Team Note  Name: Karen Rios Date of Birth: August 13, 1955 MRN: [id number] Date: 06/25/2022  [facility] Quality Team has reviewed this patient's chart, please see recommendations below:  [facility] Quality Other; (KED: Kidney Health Evaluation Gap- Patient needs Urine Albumin Creatinine Ratio Test completed for gap closure. EGFR has already been completed).

## 2022-07-23 ENCOUNTER — Other Ambulatory Visit: Payer: Self-pay | Admitting: Nurse Practitioner

## 2022-07-23 DIAGNOSIS — E119 Type 2 diabetes mellitus without complications: Secondary | ICD-10-CM

## 2022-08-13 DIAGNOSIS — E119 Type 2 diabetes mellitus without complications: Secondary | ICD-10-CM | POA: Diagnosis not present

## 2022-08-13 DIAGNOSIS — I10 Essential (primary) hypertension: Secondary | ICD-10-CM | POA: Diagnosis not present

## 2022-08-13 DIAGNOSIS — E785 Hyperlipidemia, unspecified: Secondary | ICD-10-CM | POA: Diagnosis not present

## 2022-08-17 IMAGING — MG MM DIGITAL SCREENING BILAT W/ TOMO AND CAD
8 of 14 series · 8 of 40 positions shown · non-contrast
Comparison: Previous exam(s).

CLINICAL DATA: Screening.

EXAM:
DIGITAL SCREENING BILATERAL MAMMOGRAM WITH TOMOSYNTHESIS AND CAD
TECHNIQUE: Bilateral screening digital craniocaudal and mediolateral oblique
mammograms were obtained. Bilateral screening digital breast
tomosynthesis was performed. The images were evaluated with
computer-aided detection.

[L CC synth-2D]
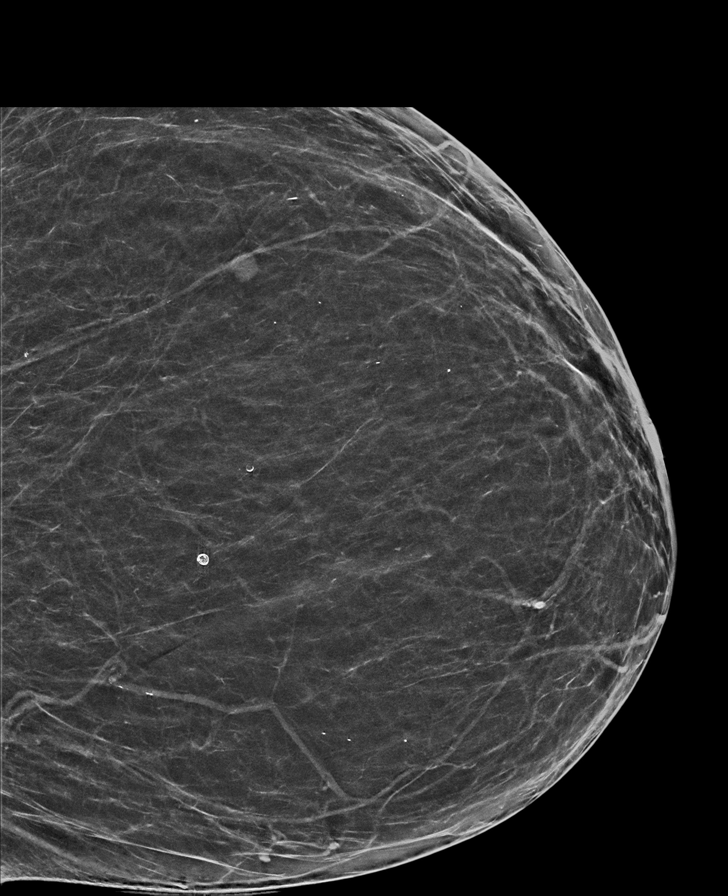

[R MLO synth-2D (1 of 2)]
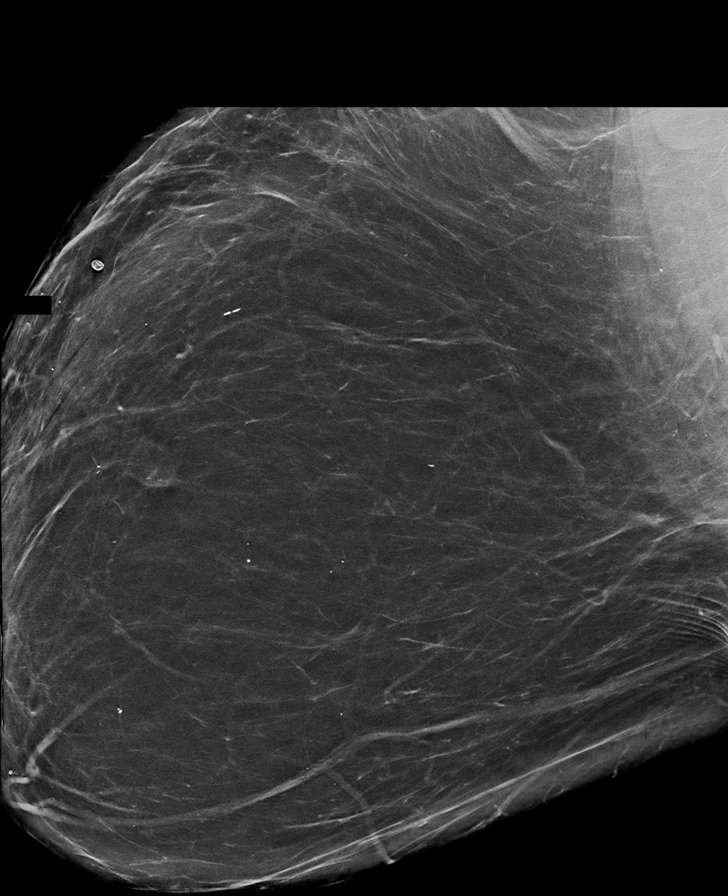

[R CC synth-2D (1 of 2)]
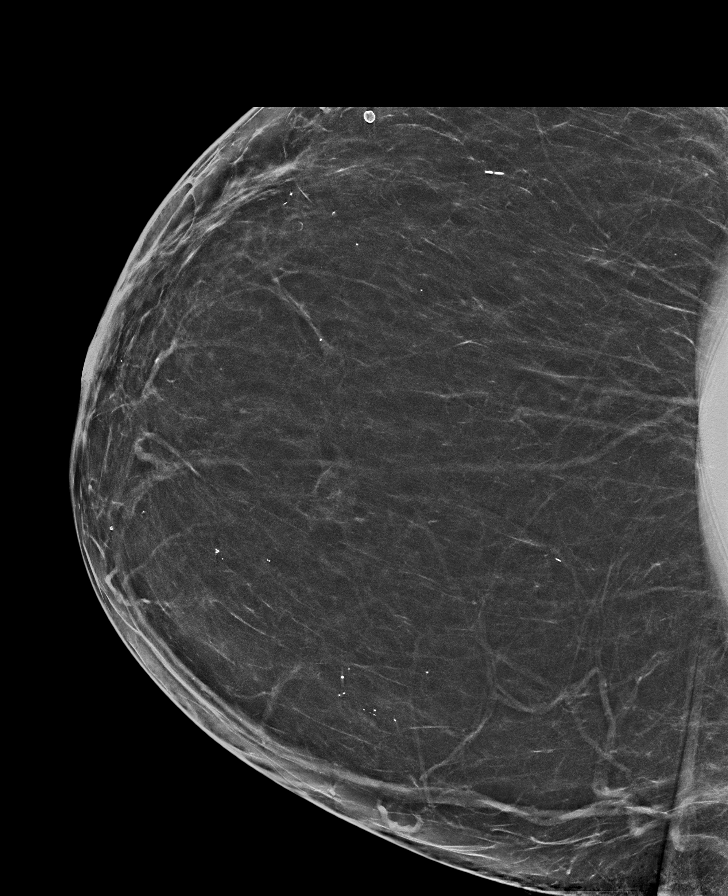

[L MLO synth-2D (1 of 2)]
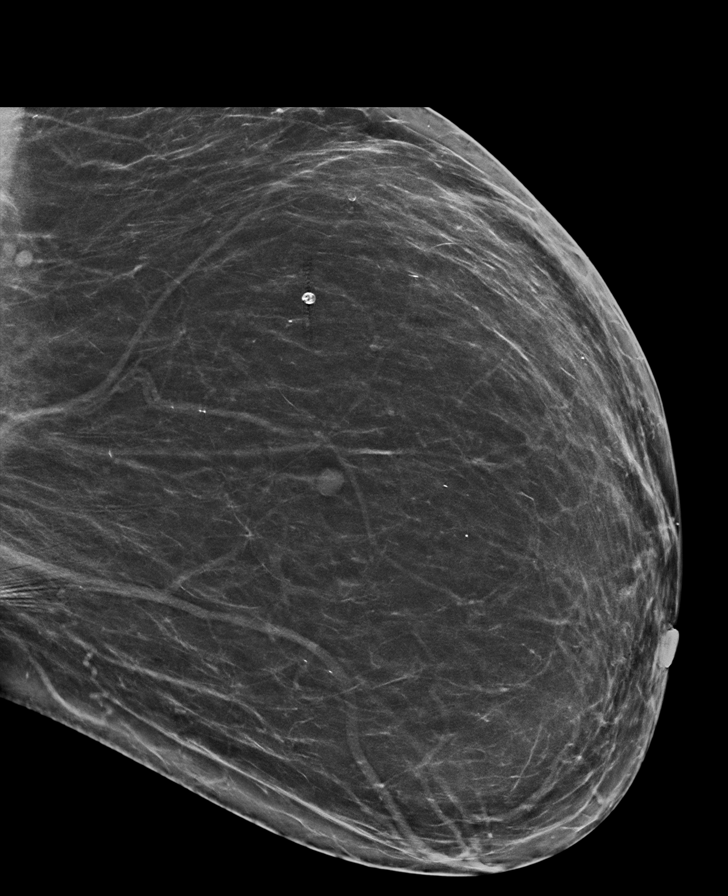

[L MLO synth-2D (2 of 2)]
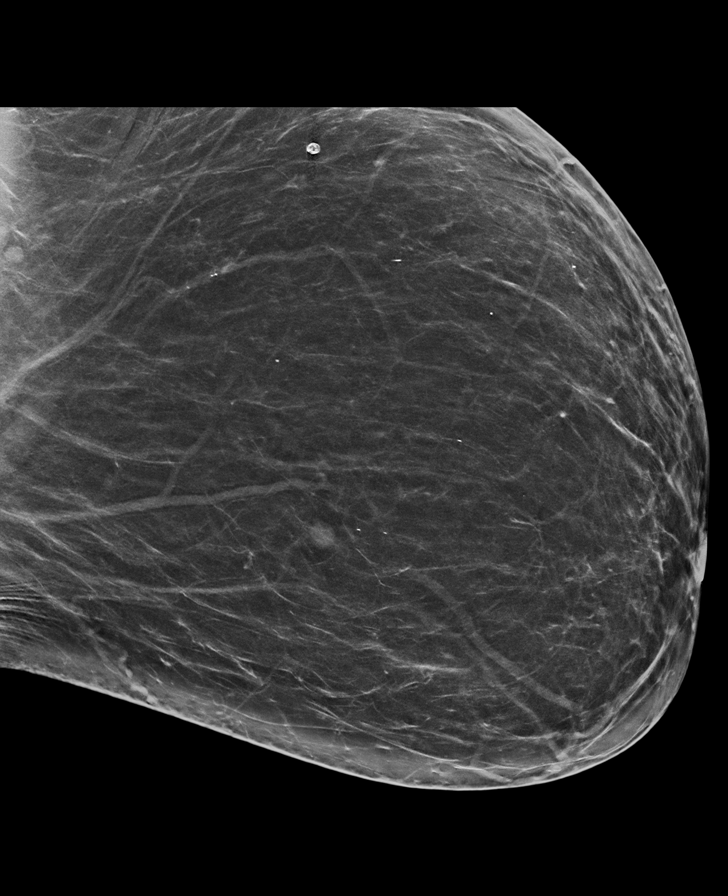

[R CC synth-2D (2 of 2)]
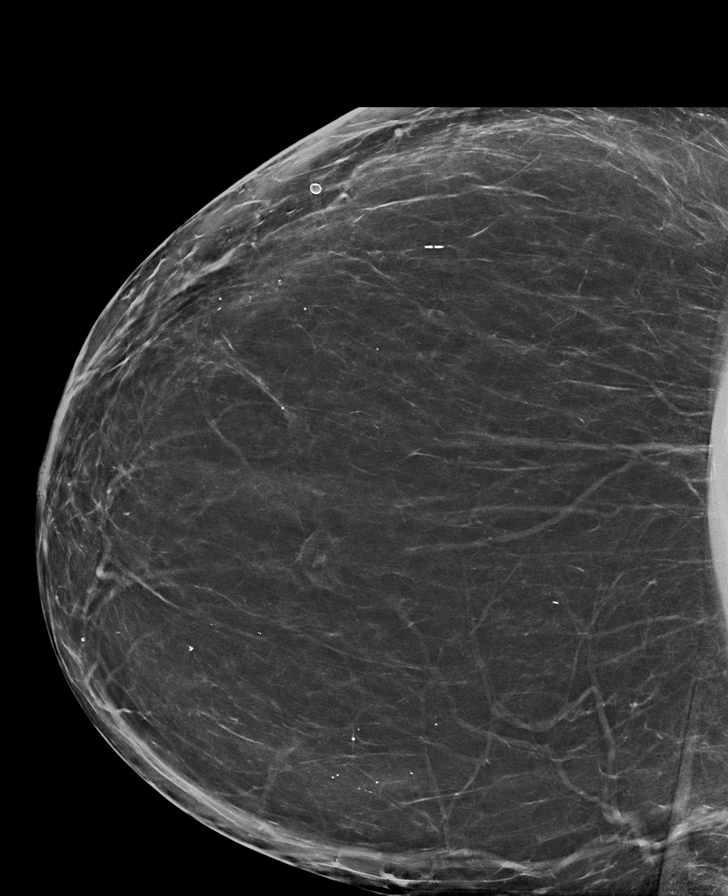

[R MLO synth-2D (2 of 2)]
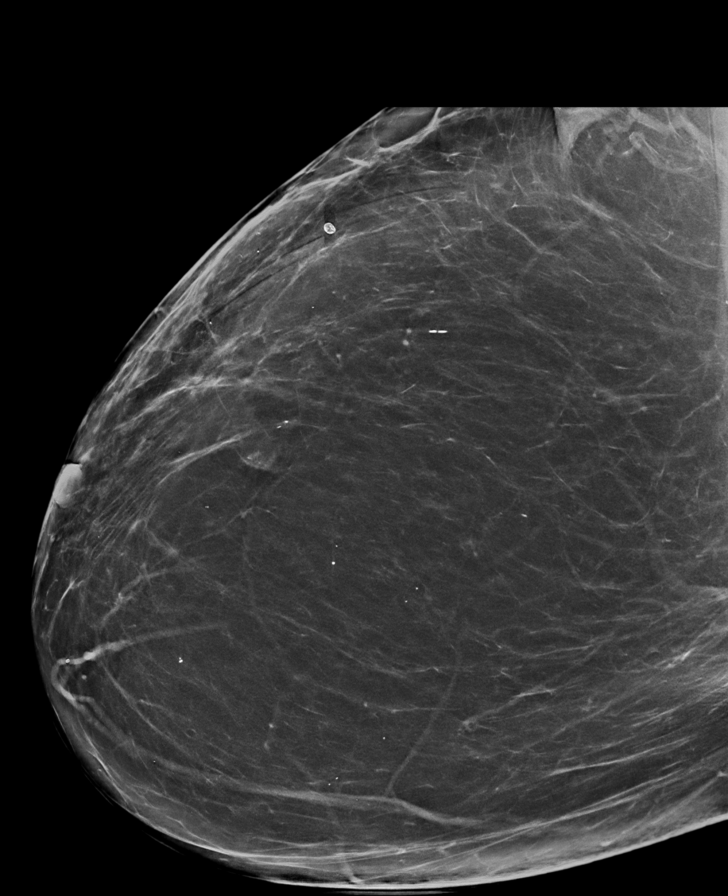

[R CC tomo · tomo slice 45/88.0]
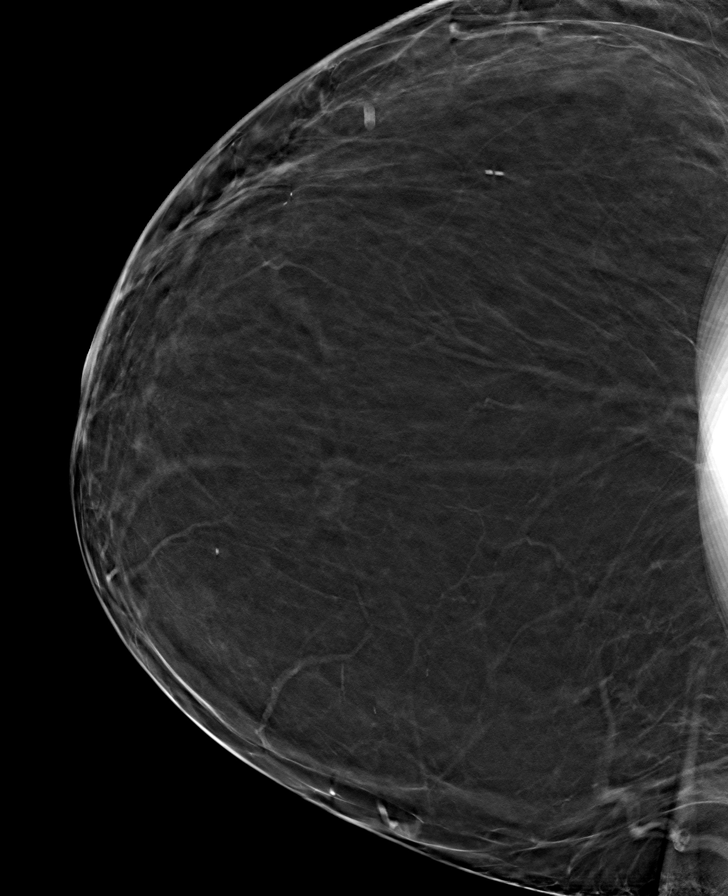

[8 of 40 positions shown; findings below may reference images not displayed]

ACR Breast Density Category b: There are scattered areas of
fibroglandular density.
FINDINGS: There are no findings suspicious for malignancy.
IMPRESSION: No mammographic evidence of malignancy. A result letter of this
screening mammogram will be mailed directly to the patient.

RECOMMENDATION:
Screening mammogram in one year. (Code:51-O-LD2)

BI-RADS CATEGORY  1: Negative.

## 2022-08-19 ENCOUNTER — Other Ambulatory Visit: Payer: Self-pay | Admitting: "Endocrinology

## 2022-09-17 DIAGNOSIS — E785 Hyperlipidemia, unspecified: Secondary | ICD-10-CM | POA: Diagnosis not present

## 2022-09-17 DIAGNOSIS — E119 Type 2 diabetes mellitus without complications: Secondary | ICD-10-CM | POA: Diagnosis not present

## 2022-09-17 DIAGNOSIS — E559 Vitamin D deficiency, unspecified: Secondary | ICD-10-CM | POA: Diagnosis not present

## 2022-09-17 DIAGNOSIS — I10 Essential (primary) hypertension: Secondary | ICD-10-CM | POA: Diagnosis not present

## 2022-10-03 ENCOUNTER — Encounter: Payer: Medicare Other | Attending: Family Medicine | Admitting: Nutrition

## 2022-10-03 ENCOUNTER — Encounter: Payer: Self-pay | Admitting: Nurse Practitioner

## 2022-10-03 ENCOUNTER — Encounter: Payer: Self-pay | Admitting: Nutrition

## 2022-10-03 ENCOUNTER — Ambulatory Visit: Payer: Medicare Other | Admitting: Nurse Practitioner

## 2022-10-03 VITALS — BP 160/100 | HR 98 | Ht 69.0 in | Wt 253.6 lb

## 2022-10-03 DIAGNOSIS — E785 Hyperlipidemia, unspecified: Secondary | ICD-10-CM | POA: Diagnosis not present

## 2022-10-03 DIAGNOSIS — E782 Mixed hyperlipidemia: Secondary | ICD-10-CM | POA: Insufficient documentation

## 2022-10-03 DIAGNOSIS — E559 Vitamin D deficiency, unspecified: Secondary | ICD-10-CM | POA: Insufficient documentation

## 2022-10-03 DIAGNOSIS — E119 Type 2 diabetes mellitus without complications: Secondary | ICD-10-CM

## 2022-10-03 DIAGNOSIS — I10 Essential (primary) hypertension: Secondary | ICD-10-CM | POA: Diagnosis not present

## 2022-10-03 DIAGNOSIS — Z6837 Body mass index (BMI) 37.0-37.9, adult: Secondary | ICD-10-CM | POA: Insufficient documentation

## 2022-10-03 DIAGNOSIS — E673 Hypervitaminosis D: Secondary | ICD-10-CM

## 2022-10-03 LAB — POCT GLYCOSYLATED HEMOGLOBIN (HGB A1C): Hemoglobin A1C: 5.8 % — AB (ref 4.0–5.6)

## 2022-10-03 NOTE — Progress Notes (Signed)
10/03/2022  Endocrinology follow-up note   Subjective:    Patient ID: Karen Rios, female    DOB: 16-Nov-1955, PCP Leslie Andrea, MD   Past Medical History:  Diagnosis Date   Arthritis    Diabetes mellitus without complication ([place])    Hypertension    Past Surgical History:  Procedure Laterality Date   TUBAL LIGATION     Social History   Socioeconomic History   Marital status: Widowed    Spouse name: Not on file   Number of children: Not on file   Years of education: Not on file   Highest education level: Not on file  Occupational History   Occupation: retired  Tobacco Use   Smoking status: Never   Smokeless tobacco: Never  Vaping Use   Vaping Use: Never used  Substance and Sexual Activity   Alcohol use: No   Drug use: No   Sexual activity: Not on file  Other Topics Concern   Not on file  Social History Narrative   Not on file   Social Determinants of Health   Financial Resource Strain: Not on file  Food Insecurity: Not on file  Transportation Needs: Not on file  Physical Activity: Not on file  Stress: Not on file  Social Connections: Not on file   Outpatient Encounter Medications as of 10/03/2022  Medication Sig   Accu-Chek Softclix Lancets lancets USE TO CHECK BLOOD GLUCOSE DAILY   Ascorbic Acid (VITAMIN C) 1000 MG tablet Take 1,000 mg by mouth daily.   aspirin EC 81 MG tablet Take 81 mg by mouth daily.   Blood Glucose Monitoring Suppl (ACCU-CHEK GUIDE) w/Device KIT USE TO TEST BLOOD GLUCOSE 4 TIMES DAILY E11.65   cloNIDine (CATAPRES) 0.1 MG tablet TAKE ONE TABLET BY MOUTH 2 TIMES A DAY   glucose blood (ACCU-CHEK GUIDE) test strip USE TO CHECK BLOOD GLUCOSE ONCE DAILY.   hydrochlorothiazide (HYDRODIURIL) 25 MG tablet TAKE 1 TABLET BY MOUTH ONCE DAILY.   losartan (COZAAR) 100 MG tablet TAKE ONE TABLET BY MOUTH ONCE DAILY.   Multiple Vitamins-Minerals (CENTRUM SILVER ULTRA WOMENS) TABS Take 1 tablet by mouth every morning.   simvastatin (ZOCOR) 20  MG tablet TAKE 1 TABLET BY MOUTH ONCE DAILY.   hydroquinone 4 % cream    [DISCONTINUED] metFORMIN (GLUCOPHAGE) 500 MG tablet Take 1 tablet (500 mg total) by mouth daily with breakfast. (Patient not taking: Reported on 10/03/2022)   No facility-administered encounter medications on file as of 10/03/2022.   ALLERGIES: No Known Allergies VACCINATION STATUS:  There is no immunization history on file for this patient.  Diabetes She presents for her follow-up diabetic visit. She has type 2 diabetes mellitus. Onset time: She was diagnosed at approximate age of 69 years. Her disease course has been improving. There are no hypoglycemic associated symptoms. Pertinent negatives for hypoglycemia include no confusion, headaches, pallor or seizures. Associated symptoms include weight loss. Pertinent negatives for diabetes include no chest pain, no polydipsia, no polyphagia and no polyuria. There are no hypoglycemic complications. Symptoms are stable. There are no diabetic complications. Risk factors for coronary artery disease include diabetes mellitus, dyslipidemia, hypertension, obesity, sedentary lifestyle and post-menopausal. Current diabetic treatment includes diet. She is compliant with treatment all of the time. Her weight is decreasing steadily. She is following a diabetic diet. When asked about meal planning, she reported none. She has had a previous visit with a dietitian. She participates in exercise three times a week. Her home blood glucose trend is fluctuating minimally.  Her overall blood glucose range is 130-140 mg/dl. (She presents today with her meter, no logs, showing at goal glycemic profile.  Her POCT A1c today is 5.8%, increasing slightly from last visit of 5.3%.  She still works out 3 days a week and eats right.  She recently lost a friend from complications with uncontrolled diabetes.  She has done well without medications.) An ACE inhibitor/angiotensin II receptor blocker is being taken. She  does not see a podiatrist.Eye exam is current.  Hyperlipidemia This is a chronic problem. The current episode started more than 1 year ago. The problem is controlled. Recent lipid tests were reviewed and are variable. Exacerbating diseases include chronic renal disease and diabetes. Factors aggravating her hyperlipidemia include fatty foods. Pertinent negatives include no chest pain, focal sensory loss, myalgias or shortness of breath. Current antihyperlipidemic treatment includes statins. The current treatment provides moderate improvement of lipids. Compliance problems include adherence to exercise.  Risk factors for coronary artery disease include diabetes mellitus, dyslipidemia, hypertension, obesity, post-menopausal and a sedentary lifestyle.  Hypertension This is a chronic problem. The current episode started more than 1 year ago. The problem has been waxing and waning since onset. The problem is uncontrolled. Pertinent negatives include no chest pain, headaches, palpitations or shortness of breath. There are no associated agents to hypertension. Risk factors for coronary artery disease include diabetes mellitus, obesity, sedentary lifestyle, post-menopausal state and dyslipidemia. Past treatments include ACE inhibitors and diuretics. The current treatment provides mild improvement. Compliance problems include exercise.  Hypertensive end-organ damage includes kidney disease. Identifiable causes of hypertension include chronic renal disease.    Review of systems  Constitutional: +steadily decreasing body weight,  current Body mass index is 37.45 kg/m. , no fatigue, no subjective hyperthermia, no subjective hypothermia Eyes: no blurry vision, no xerophthalmia ENT: no sore throat, no nodules palpated in throat, no dysphagia/odynophagia, no hoarseness Cardiovascular: no chest pain, no shortness of breath, no palpitations, no leg swelling Respiratory: no cough, no shortness of  breath Gastrointestinal: no nausea/vomiting/diarrhea Musculoskeletal: no muscle/joint aches, walks with walker due to disequilibrium and knee pain Skin: no rashes, no hyperemia Neurological: no tremors, no numbness, no tingling, no dizziness Psychiatric: no depression, no anxiety   Objective:    BP (!) 160/100 (BP Location: Right Arm, Patient Position: Sitting, Cuff Size: Large) Comment: Retake with manuel cuff  Pulse 98   Ht 5\' 9"  (1.753 m)   Wt 253 lb 9.6 oz (115 kg)   BMI 37.45 kg/m   Wt Readings from Last 3 Encounters:  10/03/22 253 lb 9.6 oz (115 kg)  04/04/22 258 lb (117 kg)  04/04/22 258 lb 6.4 oz (117.2 kg)     BP Readings from Last 3 Encounters:  10/03/22 (!) 160/100  04/04/22 (!) 171/110  10/02/21 (!) 189/91     Physical Exam- Limited  Constitutional:  Body mass index is 37.45 kg/m. , not in acute distress, normal state of mind Eyes:  EOMI, no exophthalmos Musculoskeletal: walks with walker due to disequilibrium and knee pain Skin:  no rashes, no hyperemia Neurological: no tremor with outstretched hands   Diabetic Foot Exam - Simple   Simple Foot Form Diabetic Foot exam was performed with the following findings: Yes 10/03/2022 10:09 AM  Visual Inspection No deformities, no ulcerations, no other skin breakdown bilaterally: Yes Sensation Testing Intact to touch and monofilament testing bilaterally: Yes Pulse Check Posterior Tibialis and Dorsalis pulse intact bilaterally: Yes Comments     CMP     Component Value  Date/Time   NA 143 03/25/2022 0817   K 4.2 03/25/2022 0817   CL 103 03/25/2022 0817   CO2 25 03/25/2022 0817   GLUCOSE 110 (H) 03/25/2022 0817   GLUCOSE 112 (H) 03/27/2020 0801   BUN 12 03/25/2022 0817   CREATININE 0.73 03/25/2022 0817   CREATININE 0.76 03/27/2020 0801   CALCIUM 9.8 03/25/2022 0817   PROT 7.1 03/25/2022 0817   ALBUMIN 4.4 03/25/2022 0817   AST 16 03/25/2022 0817   ALT 14 03/25/2022 0817   ALKPHOS 86 03/25/2022 0817    BILITOT 0.5 03/25/2022 0817   GFRNONAA 82 03/27/2020 0801   [place] 95 03/27/2020 0801     Diabetic Labs (most recent): Lab Results  Component Value Date   HGBA1C 5.8 (A) 10/03/2022   HGBA1C 5.3 04/04/2022   HGBA1C 5.7 10/02/2021   MICROALBUR 58.5 03/27/2020   MICROALBUR 8.9 11/14/2016      Assessment & Plan:   1) Uncontrolled type 2 diabetes mellitus without complication, without long-term current use of insulin ([place])  -She  remains at a high risk for more acute and chronic complications of diabetes which include CAD, CVA, CKD, retinopathy, and neuropathy. These are all discussed in detail with the patient.  She presents today with her meter, no logs, showing at goal glycemic profile.  Her POCT A1c today is 5.8%, increasing slightly from last visit of 5.3%.  She still works out 3 days a week and eats right.  She recently lost a friend from complications with uncontrolled diabetes.  She has done well without medications.  -  Recent labs reviewed.  - Nutritional counseling repeated at each appointment due to patients tendency to fall back in to old habits.  - The patient admits there is a room for improvement in their diet and drink choices. -  Suggestion is made for the patient to avoid simple carbohydrates from their diet including Cakes, Sweet Desserts / Pastries, Ice Cream, Soda (diet and regular), Sweet Tea, Candies, Chips, Cookies, Sweet Pastries, Store Bought Juices, Alcohol in Excess of 1-2 drinks a day, Artificial Sweeteners, Coffee Creamer, and "Sugar-free" Products. This will help patient to have stable blood glucose profile and potentially avoid unintended weight gain.   - I encouraged the patient to switch to unprocessed or minimally processed complex starch and increased protein intake (animal or plant source), fruits, and vegetables.   - Patient is advised to stick to a routine mealtimes to eat 3 meals a day and avoid unnecessary snacks (to snack only to correct  hypoglycemia).  - I have approached patient with the following individualized plan to manage diabetes and patient agrees.  -Due to her stable glycemic profile, she can be kept off all meds at this time.  She does not need to routinely monitor glucose at this time but she prefers to monitor at least once daily.  - Patient will be considered for incretin therapy  If she loses control.  - Patient specific target  for A1c; LDL, HDL, Triglycerides, and  Waist Circumference were discussed in detail.  2) BP/HTN:  Her blood pressure is not controlled to target.  She says she did take her medications this morning and reports her BP is always elevated because she gets nervous.  She is advised to continue Clonidine 0.1 mg po twice daily, HCTZ 25 mg po daily, and Cozaar 100 mg po daily.    3) Lipids/HPL:  Her most recent lipid panel from 03/26/21 shows controlled LDL of 83 and elevated triglycerides of  173-improving.  She is advised to continue Simvastatin 20 mg po daily at bedtime.  She is advised to avoid fried foods or foods prepared with butter.    4)  Weight/Diet:  Her Body mass index is 37.45 kg/m.-- clearly complicating her diabetes care.  She is a candidate for modest weight loss.  CDE consult in progress, exercise, and carbohydrates information provided.  5) Hypervitaminosis D: Her recent vitamin D level from 03/26/21 was high at 154.  She takes several supplements with vitamin D in them.  She is advised to stop her Vitamin D supplements for now.  6) Chronic Care/Health Maintenance: -Patient is on ACEI/ARB and Statin medications and encouraged to continue to follow up with Ophthalmology, Podiatrist at least yearly or according to recommendations, and advised to  stay away from smoking. I have recommended yearly flu vaccine and pneumonia vaccination at least every 5 years; moderate intensity exercise for up to 150 minutes weekly; and  sleep for at least 7 hours a day.  - I advised patient to  maintain close follow up with Leslie Andrea, MD for primary care needs.     I spent  41  minutes in the care of the patient today including review of labs from [place], Lipids, Thyroid Function, Hematology (current and previous including abstractions from other facilities); face-to-face time discussing  her blood glucose readings/logs, discussing hypoglycemia and hyperglycemia episodes and symptoms, medications doses, her options of short and long term treatment based on the latest standards of care / guidelines;  discussion about incorporating lifestyle medicine;  and documenting the encounter. Risk reduction counseling performed per USPSTF guidelines to reduce obesity and cardiovascular risk factors.     Please refer to Patient Instructions for Blood Glucose Monitoring and Insulin/Medications Dosing Guide"  in media tab for additional information. Please  also refer to " Patient Self Inventory" in the Media  tab for reviewed elements of pertinent patient history.  Karen Rios participated in the discussions, expressed understanding, and voiced agreement with the above plans.  All questions were answered to her satisfaction. she is encouraged to contact clinic should she have any questions or concerns prior to her return visit.    Follow up plan: -Return in about 1 year (around 10/03/2023) for Diabetes F/U- A1c and UM in office.   Rayetta Pigg, [facility] [address] [place], [place] [address] Phone: [phone] Fax: [phone]    10/03/2022, 10:16 AM

## 2022-10-03 NOTE — Patient Instructions (Addendum)
Goals Keep up the great job!! Lose 10 lbs by next visit by next visit. Keep focusing on whole plant based foods.

## 2022-10-03 NOTE — Progress Notes (Signed)
Medical Nutrition Therapy:  Appt start time: 1030  end time: 1045 Assessment:  Assessment:  Primary concerns today: Diabetes Type  Lost another 5 lbs. Still going to the [facility] a few times per week for water aerobics. A1C 5.8% Making great progress. Saw Rayetta Pigg today. Stopped Metformin   Lab Results  Component Value Date   HGBA1C 5.8 (A) 10/03/2022       Latest Ref Rng & Units 03/25/2022    8:17 AM 03/26/2021    8:09 AM 09/25/2020    8:07 AM  CMP  Glucose 70 - 99 mg/dL 110  110  119   BUN 8 - 27 mg/dL 12  10  13    Creatinine 0.57 - 1.00 mg/dL 0.73  0.79  0.84   Sodium 134 - 144 mmol/L 143  140  144   Potassium 3.5 - 5.2 mmol/L 4.2  4.0  3.9   Ch[place]ide 96 - 106 mmol/L 103  101  103   CO2 20 - 29 mmol/L 25  25  25    Calcium 8.7 - 10.3 mg/dL 9.8  9.9  9.8   Total Protein 6.0 - 8.5 g/dL 7.1  7.5  7.5   Total Bilirubin 0.0 - 1.2 mg/dL 0.5  0.4  0.5   Alkaline Phos 44 - 121 IU/L 86  88  95   AST 0 - 40 IU/L 16  21  15    ALT 0 - 32 IU/L 14  12  14     Preferred Learning Style:   No preference indicated   Learning Readiness:  Ready Change in progress  MEDICATIONS: see list   DIETARY INTAKE:  24-hr recall:  B ( AM): Oatmeal boiled egg and fruit, water  Snk ( AM): none L ( PM):Chicken and salad, water or fish,water D ( PM):Salad, veggies, pintos beans, water  Usual physical activity:  Goinng to [facility] for water aerobics 4 times per week and walking in the pool  Estimated energy needs: 1800 calories 200 g carbohydrates 135 g protein 50 g fat  Progress Towards Goal(s):  In progress.   Nutritional Diagnosis:  NB-1.1 Food and nutrition-related knowledge deficit As related to Diabetes.  As evidenced by A1C that was 18% and now down to 5.5% .    Intervention:  Nutrition counseling and diabetes education on meal planning with My Plate, carb counting, portion sizes, weight loss tips, benefits of exercise and prevention of complications from DM.  Weight loss tips and  increasing physical activity. Avoiding salty processed foods.  Goals Keep up the great job!! Lose 10 lbs by next visit 5 lbs by next visit. Keep focusing on whole plant based foods.     Teaching Method Utilized:Visual Auditory Hands on  Handouts given during visit include: The Plate Method Meal Plan Card  Barriers to learning/adherence to lifestyle change: none  Demonstrated degree of understanding via:  Teach Back   Monitoring/Evaluation:  Dietary intake, exercise, meal planning, and body weight in 6 month(s) to continue to work on weight loss. Marland Kitchen

## 2022-10-22 ENCOUNTER — Other Ambulatory Visit: Payer: Self-pay | Admitting: Nurse Practitioner

## 2022-11-18 ENCOUNTER — Other Ambulatory Visit: Payer: Self-pay | Admitting: "Endocrinology

## 2022-11-18 DIAGNOSIS — E119 Type 2 diabetes mellitus without complications: Secondary | ICD-10-CM

## 2022-11-19 ENCOUNTER — Other Ambulatory Visit: Payer: Self-pay

## 2022-11-19 ENCOUNTER — Telehealth: Payer: Self-pay | Admitting: "Endocrinology

## 2022-11-19 DIAGNOSIS — I10 Essential (primary) hypertension: Secondary | ICD-10-CM | POA: Diagnosis not present

## 2022-11-19 DIAGNOSIS — E119 Type 2 diabetes mellitus without complications: Secondary | ICD-10-CM

## 2022-11-19 MED ORDER — ACCU-CHEK GUIDE W/DEVICE KIT: PACK | 0 refills | Status: AC

## 2022-11-19 NOTE — Telephone Encounter (Signed)
Patient is asking if you can call her in a new accu chek meter. [employer]

## 2022-11-19 NOTE — Telephone Encounter (Signed)
Rx sent 

## 2023-02-05 ENCOUNTER — Other Ambulatory Visit: Payer: Self-pay | Admitting: "Endocrinology

## 2023-02-05 DIAGNOSIS — E119 Type 2 diabetes mellitus without complications: Secondary | ICD-10-CM

## 2023-02-17 ENCOUNTER — Other Ambulatory Visit: Payer: Self-pay | Admitting: "Endocrinology

## 2023-02-18 DIAGNOSIS — E119 Type 2 diabetes mellitus without complications: Secondary | ICD-10-CM | POA: Diagnosis not present

## 2023-02-18 DIAGNOSIS — E559 Vitamin D deficiency, unspecified: Secondary | ICD-10-CM | POA: Diagnosis not present

## 2023-02-18 DIAGNOSIS — E782 Mixed hyperlipidemia: Secondary | ICD-10-CM | POA: Diagnosis not present

## 2023-02-18 DIAGNOSIS — I10 Essential (primary) hypertension: Secondary | ICD-10-CM | POA: Diagnosis not present

## 2023-02-18 DIAGNOSIS — Z0189 Encounter for other specified special examinations: Secondary | ICD-10-CM | POA: Diagnosis not present

## 2023-02-20 DIAGNOSIS — E782 Mixed hyperlipidemia: Secondary | ICD-10-CM | POA: Diagnosis not present

## 2023-02-20 DIAGNOSIS — E559 Vitamin D deficiency, unspecified: Secondary | ICD-10-CM | POA: Diagnosis not present

## 2023-02-20 DIAGNOSIS — E119 Type 2 diabetes mellitus without complications: Secondary | ICD-10-CM | POA: Diagnosis not present

## 2023-02-20 DIAGNOSIS — I10 Essential (primary) hypertension: Secondary | ICD-10-CM | POA: Diagnosis not present

## 2023-02-25 DIAGNOSIS — E559 Vitamin D deficiency, unspecified: Secondary | ICD-10-CM | POA: Diagnosis not present

## 2023-02-25 DIAGNOSIS — I10 Essential (primary) hypertension: Secondary | ICD-10-CM | POA: Diagnosis not present

## 2023-02-25 DIAGNOSIS — E1165 Type 2 diabetes mellitus with hyperglycemia: Secondary | ICD-10-CM | POA: Diagnosis not present

## 2023-02-25 DIAGNOSIS — E782 Mixed hyperlipidemia: Secondary | ICD-10-CM | POA: Diagnosis not present

## 2023-02-25 DIAGNOSIS — Z8249 Family history of ischemic heart disease and other diseases of the circulatory system: Secondary | ICD-10-CM | POA: Diagnosis not present

## 2023-02-25 DIAGNOSIS — Z713 Dietary counseling and surveillance: Secondary | ICD-10-CM | POA: Diagnosis not present

## 2023-02-25 DIAGNOSIS — E119 Type 2 diabetes mellitus without complications: Secondary | ICD-10-CM | POA: Diagnosis not present

## 2023-02-25 DIAGNOSIS — Z79899 Other long term (current) drug therapy: Secondary | ICD-10-CM | POA: Diagnosis not present

## 2023-03-25 ENCOUNTER — Other Ambulatory Visit: Payer: Self-pay | Admitting: "Endocrinology

## 2023-03-25 DIAGNOSIS — I10 Essential (primary) hypertension: Secondary | ICD-10-CM | POA: Diagnosis not present

## 2023-03-25 DIAGNOSIS — E119 Type 2 diabetes mellitus without complications: Secondary | ICD-10-CM

## 2023-03-25 DIAGNOSIS — Z79899 Other long term (current) drug therapy: Secondary | ICD-10-CM | POA: Diagnosis not present

## 2023-03-27 ENCOUNTER — Other Ambulatory Visit: Payer: Self-pay

## 2023-05-09 ENCOUNTER — Other Ambulatory Visit: Payer: Self-pay | Admitting: "Endocrinology

## 2023-05-22 DIAGNOSIS — I10 Essential (primary) hypertension: Secondary | ICD-10-CM | POA: Diagnosis not present

## 2023-05-22 DIAGNOSIS — E119 Type 2 diabetes mellitus without complications: Secondary | ICD-10-CM | POA: Diagnosis not present

## 2023-05-28 DIAGNOSIS — Z0000 Encounter for general adult medical examination without abnormal findings: Secondary | ICD-10-CM | POA: Diagnosis not present

## 2023-05-28 DIAGNOSIS — E782 Mixed hyperlipidemia: Secondary | ICD-10-CM | POA: Diagnosis not present

## 2023-05-28 DIAGNOSIS — E559 Vitamin D deficiency, unspecified: Secondary | ICD-10-CM | POA: Diagnosis not present

## 2023-05-28 DIAGNOSIS — R809 Proteinuria, unspecified: Secondary | ICD-10-CM | POA: Diagnosis not present

## 2023-05-28 DIAGNOSIS — E119 Type 2 diabetes mellitus without complications: Secondary | ICD-10-CM | POA: Diagnosis not present

## 2023-05-28 DIAGNOSIS — E1129 Type 2 diabetes mellitus with other diabetic kidney complication: Secondary | ICD-10-CM | POA: Diagnosis not present

## 2023-05-28 DIAGNOSIS — I10 Essential (primary) hypertension: Secondary | ICD-10-CM | POA: Diagnosis not present

## 2023-06-23 ENCOUNTER — Other Ambulatory Visit: Payer: Self-pay | Admitting: Nurse Practitioner

## 2023-06-23 DIAGNOSIS — E119 Type 2 diabetes mellitus without complications: Secondary | ICD-10-CM

## 2023-07-03 ENCOUNTER — Other Ambulatory Visit: Payer: Self-pay | Admitting: "Endocrinology

## 2023-07-11 ENCOUNTER — Other Ambulatory Visit: Payer: Self-pay | Admitting: Nurse Practitioner

## 2023-07-30 ENCOUNTER — Other Ambulatory Visit: Payer: Self-pay | Admitting: "Endocrinology

## 2023-08-26 ENCOUNTER — Telehealth: Payer: Self-pay | Admitting: Nurse Practitioner

## 2023-08-26 DIAGNOSIS — E119 Type 2 diabetes mellitus without complications: Secondary | ICD-10-CM

## 2023-08-26 MED ORDER — ACCU-CHEK GUIDE TEST VI STRP: ORAL_STRIP | 0 refills | Status: DC

## 2023-08-26 NOTE — Telephone Encounter (Signed)
Patient is asking for more quantity of strips to be sent into [place]. She states they are not giving her enough. She currently uses Accu [facility]k [facility]. Thanks.

## 2023-08-26 NOTE — Telephone Encounter (Signed)
Sent in a 90 day supply. Pt does not need to test daily but if she does only once daily. Pt was notified of Rx and also to sched appt. She will call back to do this.

## 2023-09-29 ENCOUNTER — Other Ambulatory Visit: Payer: Self-pay | Admitting: Nurse Practitioner

## 2023-10-07 ENCOUNTER — Encounter: Payer: Self-pay | Admitting: Nurse Practitioner

## 2023-10-07 ENCOUNTER — Ambulatory Visit: Payer: Medicare Other | Admitting: Nurse Practitioner

## 2023-10-07 VITALS — BP 138/88 | HR 74 | Ht 68.0 in | Wt 266.6 lb

## 2023-10-07 DIAGNOSIS — I10 Essential (primary) hypertension: Secondary | ICD-10-CM

## 2023-10-07 DIAGNOSIS — E119 Type 2 diabetes mellitus without complications: Secondary | ICD-10-CM | POA: Diagnosis not present

## 2023-10-07 DIAGNOSIS — Z6841 Body Mass Index (BMI) 40.0 and over, adult: Secondary | ICD-10-CM

## 2023-10-07 DIAGNOSIS — E782 Mixed hyperlipidemia: Secondary | ICD-10-CM | POA: Diagnosis not present

## 2023-10-07 DIAGNOSIS — E559 Vitamin D deficiency, unspecified: Secondary | ICD-10-CM | POA: Diagnosis not present

## 2023-10-07 LAB — POCT GLYCOSYLATED HEMOGLOBIN (HGB A1C): Hemoglobin A1C: 6.1 % — AB (ref 4.0–5.6)

## 2023-10-07 MED ORDER — ZEPBOUND 2.5 MG/0.5ML ~~LOC~~ SOAJ: 2.5000 mg | SUBCUTANEOUS | 0 refills | Status: DC

## 2023-10-07 MED ORDER — SIMVASTATIN 20 MG PO TABS: 20.0000 mg | ORAL_TABLET | Freq: Every day | ORAL | 3 refills | Status: AC

## 2023-10-07 MED ORDER — CLONIDINE HCL 0.1 MG PO TABS: 0.1000 mg | ORAL_TABLET | Freq: Two times a day (BID) | ORAL | 3 refills | Status: AC

## 2023-10-07 MED ORDER — HYDROCHLOROTHIAZIDE 25 MG PO TABS: 25.0000 mg | ORAL_TABLET | Freq: Every day | ORAL | 3 refills | Status: AC

## 2023-10-07 MED ORDER — LOSARTAN POTASSIUM 100 MG PO TABS: 100.0000 mg | ORAL_TABLET | Freq: Every day | ORAL | 3 refills | Status: AC

## 2023-10-07 NOTE — Progress Notes (Signed)
 10/07/2023  Endocrinology follow-up note   Subjective:    Patient ID: Karen Rios, female    DOB: [birth year], PCP Benita Stabile, MD   Past Medical History:  Diagnosis Date   Arthritis    Diabetes mellitus without complication (HCC)    Hypertension    Past Surgical History:  Procedure Laterality Date   TUBAL LIGATION     Social History   Socioeconomic History   Marital status: Widowed    Spouse name: Not on file   Number of children: Not on file   Years of education: Not on file   Highest education level: Not on file  Occupational History   Occupation: retired  Tobacco Use   Smoking status: Never   Smokeless tobacco: Never  Vaping Use   Vaping status: Never Used  Substance and Sexual Activity   Alcohol use: No   Drug use: No   Sexual activity: Not on file  Other Topics Concern   Not on file  Social History Narrative   Not on file   Social Drivers of Health   Financial Resource Strain: Not on file  Food Insecurity: Not on file  Transportation Needs: Not on file  Physical Activity: Not on file  Stress: Not on file  Social Connections: Not on file   Outpatient Encounter Medications as of 10/07/2023  Medication Sig   Accu-Chek Softclix Lancets lancets USE TO CHECK BLOOD GLUCOSE DAILY   Ascorbic Acid (VITAMIN C) 1000 MG tablet Take 1,000 mg by mouth daily.   aspirin EC 81 MG tablet Take 81 mg by mouth daily.   Blood Glucose Monitoring Suppl (ACCU-CHEK GUIDE) w/Device KIT USE TO TEST BLOOD GLUCOSE 4 TIMES DAILY   Blood Glucose Monitoring Suppl (ACCU-CHEK GUIDE) w/Device KIT USE TO TEST BLOOD GLUCOSE 4 TIMES DAILY E11.65   glucose blood (ACCU-CHEK GUIDE TEST) test strip Use as instructed once daily. E11.65   hydroquinone 4 % cream    Multiple Vitamins-Minerals (CENTRUM SILVER ULTRA WOMENS) TABS Take 1 tablet by mouth every morning.   [DISCONTINUED] cloNIDine (CATAPRES) 0.1 MG tablet TAKE ONE TABLET BY MOUTH 2 TIMES A DAY   [DISCONTINUED] FARXIGA 10 MG TABS  tablet Take 10 mg by mouth daily.   [DISCONTINUED] hydrochlorothiazide (HYDRODIURIL) 25 MG tablet TAKE 1 TABLET BY MOUTH ONCE DAILY.   [DISCONTINUED] losartan (COZAAR) 100 MG tablet TAKE ONE TABLET BY MOUTH ONCE DAILY FOR HIGH BLOOD PRESSURE.   [DISCONTINUED] simvastatin (ZOCOR) 20 MG tablet TAKE 1 TABLET BY MOUTH ONCE DAILY.   [DISCONTINUED] tirzepatide (ZEPBOUND) 2.5 MG/0.5ML Pen Inject 2.5 mg into the skin once a week.   cloNIDine (CATAPRES) 0.1 MG tablet Take 1 tablet (0.1 mg total) by mouth 2 (two) times daily.   hydrochlorothiazide (HYDRODIURIL) 25 MG tablet Take 1 tablet (25 mg total) by mouth daily.   losartan (COZAAR) 100 MG tablet Take 1 tablet (100 mg total) by mouth daily.   simvastatin (ZOCOR) 20 MG tablet Take 1 tablet (20 mg total) by mouth daily.   No facility-administered encounter medications on file as of 10/07/2023.   ALLERGIES: No Known Allergies VACCINATION STATUS:  There is no immunization history on file for this patient.  Diabetes She presents for her follow-up diabetic visit. She has type 2 diabetes mellitus. Onset time: She was diagnosed at approximate age of 23 years. Her disease course has been stable. There are no hypoglycemic associated symptoms. Pertinent negatives for hypoglycemia include no confusion, headaches, pallor or seizures. Associated symptoms include polyuria. Pertinent negatives for  diabetes include no chest pain, no polydipsia, no polyphagia and no weight loss. There are no hypoglycemic complications. Symptoms are stable. There are no diabetic complications. Risk factors for coronary artery disease include diabetes mellitus, dyslipidemia, hypertension, obesity, sedentary lifestyle and post-menopausal. Current diabetic treatment includes oral agent (monotherapy) (PCP started her on Farxiga due to proteinuria). She is compliant with treatment all of the time. Her weight is decreasing steadily. She is following a diabetic diet. When asked about meal  planning, she reported none. She has had a previous visit with a dietitian. She participates in exercise three times a week. Her home blood glucose trend is fluctuating minimally. (She presents today with no meter, no logs, was not asked to routinely monitor glucose due to being taken off medications.  Her POCT A1c today is 6.1%, increasing slightly from last visit of 6%.  She still works out 3 days a week and eats right, will be going back to the pool to exercise once it opens.  Her PCP did start her on Farxiga for protein in the urine.) An ACE inhibitor/angiotensin II receptor blocker is being taken. She does not see a podiatrist.Eye exam is current.  Hyperlipidemia This is a chronic problem. The current episode started more than 1 year ago. The problem is controlled. Recent lipid tests were reviewed and are variable. Exacerbating diseases include chronic renal disease and diabetes. Factors aggravating her hyperlipidemia include fatty foods. Pertinent negatives include no chest pain, focal sensory loss, myalgias or shortness of breath. Current antihyperlipidemic treatment includes statins. The current treatment provides moderate improvement of lipids. Compliance problems include adherence to exercise.  Risk factors for coronary artery disease include diabetes mellitus, dyslipidemia, hypertension, obesity, post-menopausal and a sedentary lifestyle.  Hypertension This is a chronic problem. The current episode started more than 1 year ago. The problem has been waxing and waning since onset. The problem is uncontrolled. Pertinent negatives include no chest pain, headaches, palpitations or shortness of breath. There are no associated agents to hypertension. Risk factors for coronary artery disease include diabetes mellitus, obesity, sedentary lifestyle, post-menopausal state and dyslipidemia. Past treatments include ACE inhibitors and diuretics. The current treatment provides mild improvement. Compliance problems  include exercise.  Hypertensive end-organ damage includes kidney disease. Identifiable causes of hypertension include chronic renal disease.    Review of systems  Constitutional: +fluctuating body weight,  current Body mass index is 40.54 kg/m. , no fatigue, no subjective hyperthermia, no subjective hypothermia Eyes: no blurry vision, no xerophthalmia ENT: no sore throat, no nodules palpated in throat, no dysphagia/odynophagia, no hoarseness Cardiovascular: no chest pain, no shortness of breath, no palpitations, no leg swelling Respiratory: no cough, no shortness of breath Gastrointestinal: no nausea/vomiting/diarrhea Musculoskeletal: no muscle/joint aches, walks with walker due to disequilibrium and knee pain Skin: no rashes, no hyperemia Neurological: no tremors, no numbness, no tingling, no dizziness Psychiatric: no depression, no anxiety   Objective:    BP 138/88 (BP Location: Right Arm, Patient Position: Sitting, Cuff Size: Large)   Pulse 74   Ht 5\' 8"  (1.727 m)   Wt 266 lb 9.6 oz (120.9 kg)   BMI 40.54 kg/m   Wt Readings from Last 3 Encounters:  10/07/23 266 lb 9.6 oz (120.9 kg)  10/03/22 253 lb 9.6 oz (115 kg)  04/04/22 258 lb (117 kg)     BP Readings from Last 3 Encounters:  10/07/23 138/88  10/03/22 (!) 160/100  04/04/22 (!) 171/110     Physical Exam- Limited  Constitutional:  Body mass  index is 40.54 kg/m. , not in acute distress, normal state of mind, patients smells of foul urine Eyes:  EOMI, no exophthalmos Musculoskeletal: walks with walker due to disequilibrium and knee pain Skin:  no rashes, no hyperemia Neurological: no tremor with outstretched hands   Diabetic Foot Exam - Simple   No data filed     CMP     Component Value Date/Time   NA 143 03/25/2022 0817   K 4.2 03/25/2022 0817   CL 103 03/25/2022 0817   CO2 25 03/25/2022 0817   GLUCOSE 110 (H) 03/25/2022 0817   GLUCOSE 112 (H) 03/27/2020 0801   BUN 12 03/25/2022 0817   CREATININE  0.73 03/25/2022 0817   CREATININE 0.76 03/27/2020 0801   CALCIUM 9.8 03/25/2022 0817   PROT 7.1 03/25/2022 0817   ALBUMIN 4.4 03/25/2022 0817   AST 16 03/25/2022 0817   ALT 14 03/25/2022 0817   ALKPHOS 86 03/25/2022 0817   BILITOT 0.5 03/25/2022 0817   GFRNONAA 82 03/27/2020 0801   GFRAA 95 03/27/2020 0801     Diabetic Labs (most recent): Lab Results  Component Value Date   HGBA1C 6.1 (A) 10/07/2023   HGBA1C 5.8 (A) 10/03/2022   HGBA1C 5.3 04/04/2022   MICROALBUR 58.5 03/27/2020   MICROALBUR 8.9 11/14/2016      Assessment & Plan:   1) Uncontrolled type 2 diabetes mellitus without complication, without long-term current use of insulin (HCC)  -She  remains at a high risk for more acute and chronic complications of diabetes which include CAD, CVA, CKD, retinopathy, and neuropathy. These are all discussed in detail with the patient.  She presents today with no meter, no logs, was not asked to routinely monitor glucose due to being taken off medications.  Her POCT A1c today is 6.1%, increasing slightly from last visit of 6%.  She still works out 3 days a week and eats right, will be going back to the pool to exercise once it opens.  Her PCP did start her on Farxiga for protein in the urine.  -  Recent labs reviewed.  - Nutritional counseling repeated at each appointment due to patients tendency to fall back in to old habits.  - The patient admits there is a room for improvement in their diet and drink choices. -  Suggestion is made for the patient to avoid simple carbohydrates from their diet including Cakes, Sweet Desserts / Pastries, Ice Cream, Soda (diet and regular), Sweet Tea, Candies, Chips, Cookies, Sweet Pastries, Store Bought Juices, Alcohol in Excess of 1-2 drinks a day, Artificial Sweeteners, Coffee Creamer, and "Sugar-free" Products. This will help patient to have stable blood glucose profile and potentially avoid unintended weight gain.   - I encouraged the patient to  switch to unprocessed or minimally processed complex starch and increased protein intake (animal or plant source), fruits, and vegetables.   - Patient is advised to stick to a routine mealtimes to eat 3 meals a day and avoid unnecessary snacks (to snack only to correct hypoglycemia).  - I have approached patient with the following individualized plan to manage diabetes and patient agrees.  -I did discuss the side effects of SGLT2i with her today including increased risk of UTI.  She denies any overt symptoms of such but she does have an odor of foul urine today.  She also notes the Marcelline Deist was really expensive as well.  Due to this, I will stop her Marcelline Deist today.  We did discuss GLP1 but after talking about  side effects and administration, she prefers to stay off of it.  She does not need to routinely monitor glucose at this time but she prefers to monitor at least once daily.  - Patient will be considered for incretin therapy  If she loses control.  - Patient specific target  for A1c; LDL, HDL, Triglycerides, and  Waist Circumference were discussed in detail.  2) BP/HTN:  Her blood pressure is controlled to target.  She is advised to continue Clonidine 0.1 mg po twice daily, HCTZ 25 mg po daily, and Cozaar 100 mg po daily.    3) Lipids/HPL:  Her most recent lipid panel from 05/22/23 shows controlled LDL of 85 and elevated triglycerides of 173-improving.  She is advised to continue Simvastatin 20 mg po daily at bedtime.  She is advised to avoid fried foods or foods prepared with butter.    4)  Weight/Diet:  Her Body mass index is 40.54 kg/m.-- clearly complicating her diabetes care.  She is a candidate for modest weight loss.  CDE consult in progress, exercise, and carbohydrates information provided.  5) Hypervitaminosis D: Her recent vitamin D level from 03/26/21 was high at 154.  She takes several supplements with vitamin D in them.  She is advised to stop her Vitamin D supplements for  now.  6) Chronic Care/Health Maintenance: -Patient is on ACEI/ARB and Statin medications and encouraged to continue to follow up with Ophthalmology, Podiatrist at least yearly or according to recommendations, and advised to  stay away from smoking. I have recommended yearly flu vaccine and pneumonia vaccination at least every 5 years; moderate intensity exercise for up to 150 minutes weekly; and  sleep for at least 7 hours a day.  - I advised patient to maintain close follow up with Benita Stabile, MD for primary care needs.     I spent  36   minutes in the care of the patient today including review of labs from CMP, Lipids, Thyroid Function, Hematology (current and previous including abstractions from other facilities); face-to-face time discussing  her blood glucose readings/logs, discussing hypoglycemia and hyperglycemia episodes and symptoms, medications doses, her options of short and long term treatment based on the latest standards of care / guidelines;  discussion about incorporating lifestyle medicine;  and documenting the encounter. Risk reduction counseling performed per USPSTF guidelines to reduce obesity and cardiovascular risk factors.     Please refer to Patient Instructions for Blood Glucose Monitoring and Insulin/Medications Dosing Guide"  in media tab for additional information. Please  also refer to " Patient Self Inventory" in the Media  tab for reviewed elements of pertinent patient history.  Karen Rios participated in the discussions, expressed understanding, and voiced agreement with the above plans.  All questions were answered to her satisfaction. she is encouraged to contact clinic should she have any questions or concerns prior to her return visit.    Follow up plan: -Return in about 1 year (around 10/06/2024) for Diabetes F/U with A1c in office, No previsit labs.   Ronny Bacon, [facility] [address] [place], [place]  [address] Phone: [phone] Fax: [phone]    10/07/2023, 9:57 AM

## 2023-10-07 NOTE — Patient Instructions (Signed)
 Advice for Weight Management  -For most of [place] the best way to lose weight is by diet management. Generally speaking, diet management means consuming less calories intentionally which over time brings about progressive weight loss.  This can be achieved more effectively by restricting carbohydrate consumption to the minimum possible.  So, it is critically important to know your numbers: how much calorie you are consuming and how much calorie you need. More importantly, our carbohydrates sources should be unprocessed or minimally processed complex starch food items.   Sometimes, it is important to balance nutrition by increasing protein intake (animal or plant source), fruits, and vegetables.  -Sticking to a routine mealtime to eat 3 meals a day and avoiding unnecessary snacks is shown to have a big role in weight control. Under normal circumstances, the only time we lose real weight is when we are hungry, so allow hunger to take place- hunger means no food between meal times, only water.  It is not advisable to starve.   -It is better to avoid simple carbohydrates including: Cakes, Sweet Desserts, Ice Cream, Soda (diet and regular), Sweet Tea, Candies, Chips, Cookies, Store Bought Juices, Alcohol in Excess of  1-2 drinks a day, Artificial Sweeteners, Doughnuts, Coffee Creamers, "Sugar-free" Products, etc, etc.  This is not a complete list...Marland Kitchen.    -Consulting with certified diabetes educators is proven to provide you with the most accurate and current information on diet.  Also, you may be  interested in discussing diet options/exchanges , we can schedule a visit with Norm Salt, RDN, CDE for individualized nutrition education.  -Exercise: If you are able: 30 -60 minutes a day ,4 days a week, or 150 minutes a week.  The longer the better.  Combine stretch, strength, and aerobic activities.  If you were told in the past that you have high risk for cardiovascular diseases, you may seek evaluation by  your heart doctor prior to initiating moderate to intense exercise programs.

## 2023-11-20 DIAGNOSIS — I10 Essential (primary) hypertension: Secondary | ICD-10-CM | POA: Diagnosis not present

## 2023-11-20 DIAGNOSIS — E1129 Type 2 diabetes mellitus with other diabetic kidney complication: Secondary | ICD-10-CM | POA: Diagnosis not present

## 2023-11-24 ENCOUNTER — Encounter: Payer: Self-pay | Admitting: Pharmacist

## 2023-11-24 NOTE — Progress Notes (Signed)
 Patient chart reviewed for medication adherence review - MAD - Diabetes  Patient initiated on SGLT-2 by PCP  At follow-up with NP, side effects of SGLT-2 were discussed.  Patient concerned over high cost of medication.  Due to this and low A1C of 6.1%, decision to stop SGLT-2 therapy  Continue to follow A1C and UACR  - consider use again in the future.  Total time with patient call and documentation of interaction: 9 minutes.

## 2023-11-27 DIAGNOSIS — I10 Essential (primary) hypertension: Secondary | ICD-10-CM | POA: Diagnosis not present

## 2023-11-27 DIAGNOSIS — E119 Type 2 diabetes mellitus without complications: Secondary | ICD-10-CM | POA: Diagnosis not present

## 2023-11-27 DIAGNOSIS — R809 Proteinuria, unspecified: Secondary | ICD-10-CM | POA: Diagnosis not present

## 2023-11-27 DIAGNOSIS — E1129 Type 2 diabetes mellitus with other diabetic kidney complication: Secondary | ICD-10-CM | POA: Diagnosis not present

## 2023-11-27 DIAGNOSIS — E782 Mixed hyperlipidemia: Secondary | ICD-10-CM | POA: Diagnosis not present

## 2023-11-27 DIAGNOSIS — E559 Vitamin D deficiency, unspecified: Secondary | ICD-10-CM | POA: Diagnosis not present

## 2024-01-22 ENCOUNTER — Telehealth: Payer: Self-pay

## 2024-01-22 NOTE — Telephone Encounter (Signed)
 Up to date on meds, next review in October

## 2024-04-12 ENCOUNTER — Telehealth: Payer: Self-pay | Admitting: Nutrition

## 2024-04-12 ENCOUNTER — Other Ambulatory Visit: Payer: Self-pay | Admitting: "Endocrinology

## 2024-04-12 NOTE — Telephone Encounter (Signed)
 Phone call to discuss meter and testing supplies.

## 2024-04-12 NOTE — Telephone Encounter (Signed)
 Thank you, Santana.  That's a perfect plan!

## 2024-04-12 NOTE — Telephone Encounter (Signed)
 TC from pt inquiring about a meter and testing supplies. Due to A1C being good A1C 6.1% and no longer in DM range, advised to go to [facility] and get a Relion Meter and testing supplies that would be cheaper than getting the Accucheck Guide Meter and supplies through her insurance. Insurance may not cover since she is no longer on DM medications and A1C is less than 6.4%. She verbalized understanding.

## 2024-04-13 ENCOUNTER — Other Ambulatory Visit: Payer: Self-pay | Admitting: Nurse Practitioner

## 2024-04-13 DIAGNOSIS — E119 Type 2 diabetes mellitus without complications: Secondary | ICD-10-CM

## 2024-10-05 ENCOUNTER — Ambulatory Visit: Admitting: Nurse Practitioner
